# Patient Record
Sex: Female | Born: 1970 | Hispanic: Yes | Marital: Married | Smoking: Never smoker
Health system: Southern US, Community
[De-identification: ages and names within clinical notes are randomized; demographics above are authoritative.]

## PROBLEM LIST (undated history)

## (undated) ENCOUNTER — Emergency Department (HOSPITAL_COMMUNITY): Disposition: A | Discharge status: Home / Self Care | Payer: No Typology Code available for payment source

## (undated) DIAGNOSIS — K8020 Calculus of gallbladder without cholecystitis without obstruction: Secondary | ICD-10-CM

---

## 1998-07-09 ENCOUNTER — Emergency Department (HOSPITAL_COMMUNITY): Admission: EM | Admit: 1998-07-09 | Discharge: 1998-07-09 | Payer: Self-pay | Admitting: Emergency Medicine

## 1999-02-15 ENCOUNTER — Other Ambulatory Visit: Admission: RE | Admit: 1999-02-15 | Discharge: 1999-02-15 | Payer: Self-pay | Admitting: Obstetrics and Gynecology

## 1999-03-20 ENCOUNTER — Encounter: Payer: Self-pay | Admitting: Obstetrics and Gynecology

## 1999-03-20 ENCOUNTER — Ambulatory Visit (HOSPITAL_COMMUNITY): Admission: RE | Admit: 1999-03-20 | Discharge: 1999-03-20 | Payer: Self-pay | Admitting: Obstetrics & Gynecology

## 1999-04-16 ENCOUNTER — Inpatient Hospital Stay (HOSPITAL_COMMUNITY): Admission: AD | Admit: 1999-04-16 | Discharge: 1999-04-16 | Payer: Self-pay | Admitting: Obstetrics & Gynecology

## 1999-08-02 ENCOUNTER — Inpatient Hospital Stay (HOSPITAL_COMMUNITY): Admission: AD | Admit: 1999-08-02 | Discharge: 1999-08-05 | Payer: Self-pay | Admitting: Obstetrics & Gynecology

## 2004-04-14 ENCOUNTER — Emergency Department (HOSPITAL_COMMUNITY): Admission: EM | Admit: 2004-04-14 | Discharge: 2004-04-14 | Payer: Self-pay | Admitting: Emergency Medicine

## 2012-05-14 ENCOUNTER — Ambulatory Visit: Payer: Self-pay | Admitting: Family Medicine

## 2012-05-14 VITALS — BP 118/78 | HR 71 | Temp 98.0°F | Resp 16 | Ht 61.5 in | Wt 152.0 lb

## 2012-05-14 DIAGNOSIS — Z124 Encounter for screening for malignant neoplasm of cervix: Secondary | ICD-10-CM

## 2012-05-14 DIAGNOSIS — Z131 Encounter for screening for diabetes mellitus: Secondary | ICD-10-CM

## 2012-05-14 LAB — POCT GLYCOSYLATED HEMOGLOBIN (HGB A1C): Hemoglobin A1C: 5.1

## 2012-05-14 NOTE — Progress Notes (Signed)
[facility] [address] [place] [address] [phone]  Date:  05/14/2012   Name:  Renee [name]   DOB:  07-Jun-1971   MRN:  [id number]  PCP:  No primary provider on file.    Chief Complaint: Gynecologic Exam   History of Present Illness:  Renee [name] is a 41 y.o. very pleasant female patient who presents with the following:  She would like a pap smear today.  She had an abnormal pap about 5 years ago, but no treatment was necessary.  She has had 2 normal paps since then.   She is otherwise doing well today.   Her mother, father and brother have diabetes.  She does not as far as she knows- she would like to be screened today.   She has 3 children- 56, 58 and 13 years old.  Married, only SA with her husband.  LMP 04/19/12  There is no problem list on file for this patient.   No past medical history on file.  No past surgical history on file.  History  Substance Use Topics  . Smoking status: Never Smoker   . Smokeless tobacco: Not on file  . Alcohol Use: Not on file    No family history on file.  No Known Allergies  Medication list has been reviewed and updated.  No current outpatient prescriptions on file prior to visit.    Review of Systems:  As per HPI- otherwise negative.   Physical Examination: Filed Vitals:   05/14/12 0904  BP: 118/78  Pulse: 71  Temp: 98 F (36.7 C)  Resp: 16   Filed Vitals:   05/14/12 0904  Height: 5' 1.5" (1.562 m)  Weight: 152 lb (68.947 kg)   Body mass index is 28.26 kg/(m^2). Ideal Body Weight: Weight in (lb) to have BMI = 25: 134.2   GEN: WDWN, NAD, Non-toxic, A & O x 3 HEENT: Atraumatic, Normocephalic. Neck supple. No masses, No LAD. Ears and Nose: No external deformity. CV: RRR, No M/G/R. No JVD. No thrill. No extra heart sounds. PULM: CTA B, no wheezes, crackles, rhonchi. No retractions. No resp. distress. No accessory muscle use. ABD: S, NT, ND, +BS. No rebound. No HSM. EXTR: No  c/c/e NEURO Normal gait.  PSYCH: Normally interactive. Conversant. Not depressed or anxious appearing.  Calm demeanor.  GU: normal exam of external genitals, vagina and cervix.  No CMT, no adnexal tenderness or masses  Results for orders placed in visit on 05/14/12  POCT GLYCOSYLATED HEMOGLOBIN (HGB A1C)      Component Value Range   Hemoglobin A1C 5.1     Assessment and Plan: 1. Screening for cervical cancer  Pap IG w/ reflex to HPV when ASC-U  2. Screening for diabetes mellitus (DM)  POCT glycosylated hemoglobin (Hb A1C)   No sign of DM.  Await her pap result and will mail her a copy  Delmont Prosch, MD

## 2012-05-17 LAB — PAP IG W/ RFLX HPV ASCU

## 2012-05-18 ENCOUNTER — Encounter: Payer: Self-pay | Admitting: Family Medicine

## 2013-02-09 ENCOUNTER — Ambulatory Visit (INDEPENDENT_AMBULATORY_CARE_PROVIDER_SITE_OTHER): Payer: 59 | Admitting: Physician Assistant

## 2013-02-09 ENCOUNTER — Other Ambulatory Visit: Payer: Self-pay | Admitting: Physician Assistant

## 2013-02-09 VITALS — BP 118/86 | HR 88 | Temp 98.3°F | Resp 16 | Ht 61.8 in | Wt 153.4 lb

## 2013-02-09 DIAGNOSIS — R1013 Epigastric pain: Secondary | ICD-10-CM

## 2013-02-09 DIAGNOSIS — R748 Abnormal levels of other serum enzymes: Secondary | ICD-10-CM

## 2013-02-09 LAB — POCT CBC
Granulocyte percent: 62.5 %G (ref 37–80)
HCT, POC: 44.8 % (ref 37.7–47.9)
Hemoglobin: 14.1 g/dL (ref 12.2–16.2)
Lymph, poc: 1.8 (ref 0.6–3.4)
MCH, POC: 29.1 pg (ref 27–31.2)
MCHC: 31.5 g/dL — AB (ref 31.8–35.4)
MCV: 92.6 fL (ref 80–97)
MID (cbc): 0.4 (ref 0–0.9)
MPV: 8.4 fL (ref 0–99.8)
POC Granulocyte: 3.6 (ref 2–6.9)
POC LYMPH PERCENT: 30.2 %L (ref 10–50)
POC MID %: 7.3 %M (ref 0–12)
Platelet Count, POC: 321 10*3/uL (ref 142–424)
RBC: 4.84 M/uL (ref 4.04–5.48)
RDW, POC: 13.7 %
WBC: 5.8 10*3/uL (ref 4.6–10.2)

## 2013-02-09 LAB — COMPREHENSIVE METABOLIC PANEL
ALT: 679 U/L — ABNORMAL HIGH (ref 0–35)
AST: 428 U/L — ABNORMAL HIGH (ref 0–37)
Albumin: 4.4 g/dL (ref 3.5–5.2)
Alkaline Phosphatase: 138 U/L — ABNORMAL HIGH (ref 39–117)
BUN: 8 mg/dL (ref 6–23)
CO2: 25 mEq/L (ref 19–32)
Calcium: 9.5 mg/dL (ref 8.4–10.5)
Chloride: 104 mEq/L (ref 96–112)
Creat: 0.67 mg/dL (ref 0.50–1.10)
Glucose, Bld: 130 mg/dL — ABNORMAL HIGH (ref 70–99)
Potassium: 4.5 mEq/L (ref 3.5–5.3)
Sodium: 138 mEq/L (ref 135–145)
Total Bilirubin: 0.5 mg/dL (ref 0.3–1.2)
Total Protein: 7.7 g/dL (ref 6.0–8.3)

## 2013-02-09 MED ORDER — RANITIDINE HCL 300 MG PO TABS: 300.0000 mg | ORAL_TABLET | Freq: Every day | ORAL | Status: DC

## 2013-02-09 NOTE — Progress Notes (Signed)
[address] [place] [address]   Phone [phone]   Subjective:    Patient ID: Renee [name], female    DOB: 1970-11-25, 42 y.o.   MRN: [id number]  HPI  Pt presents to clinic with epigastric abd pain that she had yesterday for several hours.  Today she is fine.  She has had similar pain about 3 times in the past and each time she has been at work.  She lifts some boxes at work yesterday before this happened and then ate breakfast in the cafe - egg cheese potatoes and coffee, none of which are new for her.  The pain was epigastric and radiated to the back.  She got nauseated and vomited once (tasted very acidic).  The pain spontaneously resolved.  She does not feel bad otherwise.  She has heartburn 3-4 times per week but it typically does not feel like this.     Review of Systems  Constitutional: Negative for fever and chills.  Gastrointestinal: Positive for nausea, vomiting and abdominal pain (epigastric). Negative for diarrhea, constipation and blood in stool.  Genitourinary: Negative.        Objective:   Physical Exam  Vitals reviewed. Constitutional: She is oriented to person, place, and time. She appears well-developed and well-nourished.  HENT:  Head: Normocephalic and atraumatic.  Right Ear: External ear normal.  Left Ear: External ear normal.  Eyes: Conjunctivae are normal.  Cardiovascular: Normal rate, regular rhythm and normal heart sounds.   No murmur heard. Pulmonary/Chest: Effort normal and breath sounds normal. She has no wheezes.  Musculoskeletal:       Lumbar back: She exhibits normal range of motion, no tenderness, no bony tenderness and no spasm.  Neurological: She is alert and oriented to person, place, and time.  Skin: Skin is warm and dry.  Psychiatric: She has a normal mood and affect. Her behavior is normal. Judgment and thought content normal.   Results for orders placed in visit on 02/09/13  POCT CBC      Result Value Range   WBC 5.8  4.6 -  10.2 K/uL   Lymph, poc 1.8  0.6 - 3.4   POC LYMPH PERCENT 30.2  10 - 50 %L   MID (cbc) 0.4  0 - 0.9   POC MID % 7.3  0 - 12 %M   POC Granulocyte 3.6  2 - 6.9   Granulocyte percent 62.5  37 - 80 %G   RBC 4.84  4.04 - 5.48 M/uL   Hemoglobin 14.1  12.2 - 16.2 g/dL   HCT, POC 21.3  08.6 - 47.9 %   MCV 92.6  80 - 97 fL   MCH, POC 29.1  27 - 31.2 pg   MCHC 31.5 (*) 31.8 - 35.4 g/dL   RDW, POC 57.8     Platelet Count, POC 321  142 - 424 K/uL   MPV 8.4  0 - 99.8 fL       Assessment & Plan:  Abdominal pain, epigastric- I suspect this to be reflux.  I am going to check LFTs because it is possible that it is gallbladder and we will also wait for the h pylori due to weekly heartburn that is not related to eating.  We will start zantac that she will take for 10 d and then prn heartburn.  Pt will moniotr high greasy, fatty spicy foods as well as caffeine, all of which can make reflux worse. - Plan: POCT CBC, Comprehensive metabolic panel,  ranitidine (ZANTAC) 300 MG tablet, H. pylori antibody, IgG  Benny Lennert PA-C 02/09/2013 9:35 AM

## 2013-02-10 LAB — H. PYLORI ANTIBODY, IGG: H Pylori IgG: <0.4 {ISR}

## 2013-02-10 NOTE — Addendum Note (Signed)
Addended by: Johnnette Litter on: 02/10/2013 06:42 PM   Modules accepted: Orders

## 2013-02-10 NOTE — Addendum Note (Signed)
Addended by: Morrell Riddle on: 02/10/2013 02:34 PM   Modules accepted: Orders

## 2013-02-11 LAB — HEPATITIS PANEL, ACUTE
HCV Ab: NEGATIVE
Hep A IgM: NEGATIVE
Hep B C IgM: NEGATIVE
Hepatitis B Surface Ag: NEGATIVE

## 2013-02-23 ENCOUNTER — Ambulatory Visit
Admission: RE | Admit: 2013-02-23 | Discharge: 2013-02-23 | Disposition: A | Discharge status: Home / Self Care | Payer: 59 | Source: Ambulatory Visit | Attending: Physician Assistant | Admitting: Physician Assistant

## 2013-02-23 DIAGNOSIS — R1013 Epigastric pain: Secondary | ICD-10-CM

## 2013-02-23 DIAGNOSIS — R748 Abnormal levels of other serum enzymes: Secondary | ICD-10-CM

## 2013-02-24 ENCOUNTER — Other Ambulatory Visit: Payer: Self-pay

## 2013-02-28 ENCOUNTER — Ambulatory Visit (INDEPENDENT_AMBULATORY_CARE_PROVIDER_SITE_OTHER): Payer: 59 | Admitting: Physician Assistant

## 2013-02-28 VITALS — BP 110/78 | HR 60 | Temp 98.3°F | Resp 16 | Ht 61.5 in | Wt 154.8 lb

## 2013-02-28 DIAGNOSIS — K8020 Calculus of gallbladder without cholecystitis without obstruction: Secondary | ICD-10-CM | POA: Insufficient documentation

## 2013-02-28 DIAGNOSIS — K829 Disease of gallbladder, unspecified: Secondary | ICD-10-CM

## 2013-02-28 DIAGNOSIS — R748 Abnormal levels of other serum enzymes: Secondary | ICD-10-CM

## 2013-02-28 NOTE — Progress Notes (Signed)
   [address] [place] [address]   Phone [phone]  Subjective:    Patient ID: Renee [name], female    DOB: 10/05/1970, 42 y.o.   MRN: [id number]  HPI  Pt presents to clinic for discussion about her gallbladder disease.  She has had no problems with the pain since her last OV.  She is not sure that she wants to see a surgeon because she does not want to have surgery.  Review of Systems     Objective:   Physical Exam  Vitals reviewed. Constitutional: She appears well-developed and well-nourished.  HENT:  Head: Normocephalic and atraumatic.  Right Ear: External ear normal.  Left Ear: External ear normal.  Eyes: Conjunctivae are normal.  Cardiovascular: Normal rate, regular rhythm and normal heart sounds.   No murmur heard. Pulmonary/Chest: Effort normal and breath sounds normal. No respiratory distress.  Abdominal: Soft. There is no hepatomegaly. There is no tenderness.       Assessment & Plan:  Gall bladder disease - Plan: Hepatic function panel - d/w pt that she can watch her diet and have low intake of fatty foods and if that controls her pain great - if her pain returns and if does not improve with time and she gets sick then she needs to return to clinic but for now it is ok to watch and wait.  Elevated liver enzymes - Plan: Hepatic function panel - recheck her labs -   Benny Lennert [facility] 02/28/2013 8:46 PM

## 2013-03-01 LAB — HEPATIC FUNCTION PANEL
ALT: 18 U/L (ref 0–35)
Albumin: 4.4 g/dL (ref 3.5–5.2)
Total Protein: 7.3 g/dL (ref 6.0–8.3)

## 2013-08-11 ENCOUNTER — Emergency Department (HOSPITAL_COMMUNITY): Payer: 59

## 2013-08-11 ENCOUNTER — Emergency Department (HOSPITAL_COMMUNITY)
Admission: EM | Admit: 2013-08-11 | Discharge: 2013-08-12 | Disposition: A | Discharge status: Home / Self Care | Payer: 59 | Attending: Emergency Medicine | Admitting: Emergency Medicine

## 2013-08-11 ENCOUNTER — Encounter (HOSPITAL_COMMUNITY): Payer: Self-pay | Admitting: Emergency Medicine

## 2013-08-11 DIAGNOSIS — S0990XA Unspecified injury of head, initial encounter: Secondary | ICD-10-CM | POA: Insufficient documentation

## 2013-08-11 DIAGNOSIS — S298XXA Other specified injuries of thorax, initial encounter: Secondary | ICD-10-CM | POA: Insufficient documentation

## 2013-08-11 DIAGNOSIS — M62838 Other muscle spasm: Secondary | ICD-10-CM | POA: Insufficient documentation

## 2013-08-11 DIAGNOSIS — Y9389 Activity, other specified: Secondary | ICD-10-CM | POA: Insufficient documentation

## 2013-08-11 DIAGNOSIS — Y92410 Unspecified street and highway as the place of occurrence of the external cause: Secondary | ICD-10-CM | POA: Insufficient documentation

## 2013-08-11 MED ORDER — IBUPROFEN 400 MG PO TABS
600.0000 mg | ORAL_TABLET | Freq: Once | ORAL | Status: AC
  Administered 2013-08-11: 600 mg via ORAL
  Filled 2013-08-11 (×2): qty 1

## 2013-08-11 NOTE — ED Notes (Signed)
Patient transported to X-ray 
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
## 2013-08-11 NOTE — ED Notes (Signed)
PA at bedside with student.
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
## 2013-08-12 MED ORDER — CYCLOBENZAPRINE HCL 10 MG PO TABS: 10.0000 mg | ORAL_TABLET | Freq: Two times a day (BID) | ORAL | Status: DC | PRN

## 2013-08-12 NOTE — ED Provider Notes (Signed)
Medical screening examination/treatment/procedure(s) were performed by non-physician practitioner and as supervising physician I was immediately available for consultation/collaboration.  EKG Interpretation   None         Wendi Maya, MD 08/12/13 1044

## 2013-08-19 ENCOUNTER — Ambulatory Visit (INDEPENDENT_AMBULATORY_CARE_PROVIDER_SITE_OTHER): Payer: 59 | Admitting: Emergency Medicine

## 2013-08-19 ENCOUNTER — Ambulatory Visit: Payer: 59

## 2013-08-19 VITALS — BP 118/78 | HR 73 | Temp 98.7°F | Resp 16 | Ht 61.5 in | Wt 149.8 lb

## 2013-08-19 DIAGNOSIS — R0789 Other chest pain: Secondary | ICD-10-CM

## 2013-08-19 DIAGNOSIS — S20219A Contusion of unspecified front wall of thorax, initial encounter: Secondary | ICD-10-CM

## 2013-08-19 MED ORDER — TRAMADOL HCL 50 MG PO TABS: 50.0000 mg | ORAL_TABLET | Freq: Three times a day (TID) | ORAL | Status: DC | PRN
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
## 2013-08-19 NOTE — Patient Instructions (Signed)
Chest conContusin en el trax  (Chest Contusion)  Una contusin en el trax es un hematoma profundo en esa zona. Las contusiones son el resultado de una lesin que causa sangrado debajo de la piel. Puede causar un hematoma en la piel, los msculos o las costillas. La zona de la contusin puede ponerse Bowman, Calvin o Cricket. Las lesiones menores no causan Engineer, mining, Biomedical engineer las ms graves pueden presentar dolor e inflamacin durante un par de semanas. CAUSAS  La causa de la contusin generalmente es un golpe, un traumatismo o una fuerza directa ejercida sobre una zona del cuerpo.  SNTOMAS   Hinchazn y enrojecimiento en la zona lesionada.  Cambios de coloracin de la piel en esa zona.  Sensibilidad y Art therapist.  Dolor. DIAGNSTICO  El diagnstico puede hacerse realizando una historia clnica y un examen fsico. Podra ser necesario tomar una radiografa, tomografa computada (TC) o una resonancia magntica (RMN) para determinar si hubo lesiones asociadas, como por ejemplo huesos rotos (fracturas) o lesiones internas.  TRATAMIENTO  El mejor tratamiento para la contusin en el trax es el reposo, la aplicacin de hielo y compresas fras en la zona de la lesin. Podrn indicarle ejercicios de respiracin profunda para reducir el riesgo de neumona. Para calmar el dolor tambin podrn indicarle medicamentos de venta libre.  INSTRUCCIONES PARA EL CUIDADO EN EL HOGAR   Aplique hielo sobre la zona lesionada.  Ponga el hielo en una bolsa plstica.  Colquese una toalla entre la piel y la bolsa de hielo.  Deje el hielo durante 15 a 20 minutos, 3 a 4 veces por da.  Tome slo medicamentos de venta libre o recetados, segn las indicaciones del mdico. El mdico podr indicarle que evite tomar antiinflamatorios (aspirina, ibuprofeno y naproxeno) durante 48 horas ya que estos medicamentos pueden aumentar los hematomas.  Haga que la zona lesionada repose.  Haga ejercicios de respiracin  profunda segn las indicaciones de su mdico.  Si fuma, abandone el hbito.  No levante objetos ms pesados que 5 libras (2.3 kg.) durante 3 das o ms, si se lo indican. SOLICITE ATENCIN MDICA DE INMEDIATO SI:   El hematoma o la hinchazn aumentan.  Siente dolor que College.  Tiene dificultad para respirar.  Se siente mareado, dbil o se desmaya.  Observa sangre en la orina.  Tose o vomita sangre.  La hinchazn o el dolor no se [employer]. ASEGRESE DE QUE:   Comprende estas instrucciones.  Controlar su enfermedad.  Solicitar ayuda de inmediato si no mejora o si empeora. Document Released: 06/25/2005 Document Revised: 06/09/2012 [facility] Patient Information 2014 [place], [place].

## 2013-10-11 ENCOUNTER — Ambulatory Visit (INDEPENDENT_AMBULATORY_CARE_PROVIDER_SITE_OTHER): Payer: 59 | Admitting: Family Medicine

## 2013-10-11 VITALS — BP 110/70 | HR 62 | Temp 98.3°F | Resp 16 | Ht 61.5 in | Wt 152.0 lb

## 2013-10-11 DIAGNOSIS — Z8742 Personal history of other diseases of the female genital tract: Secondary | ICD-10-CM

## 2013-10-11 NOTE — Progress Notes (Signed)
Subjective:  Patient has a history of having had an abnormal sometime ago. She was concerned thinking that it was never biopsied and she was worried about it. She had a normal Pap smear less than 2 years ago, but wondered if she should have had some kind of a biopsy done. I reviewed the old record in the computer. She has been married to the same man for over 20 years, has no suspicion of infidelity on his part, and she has been faithful to him for all these years.  Objective: Pleasant lady in no major distress no CVA tenderness. Abdomen soft without mass or tenderness. Normal external genitalia. Vaginal mucosa appears unremarkable. There is a lot of mucus which was swabbed out. The cervix appears multiparous. There is a small cyst at about 11:00. No other abnormalities were suspected. Pap was taken using the speculum in brush carefully. Patient tolerated well.  Assessment: History of abnormal Pap  Plan: Pap 2 was done. Explained to her that if it is normal we will repeat only every 3 years. If it is abnormal we will make a referral for her. She was content with that.

## 2013-10-11 NOTE — Patient Instructions (Signed)
If your Pap test is normal I will recommend checking about every 3 years  If the Pap smear is abnormal we will make a referral elsewhere.  I think your risk of cervical cancers is very very low because of your long marital history.

## 2013-10-13 LAB — PAP IG W/ RFLX HPV ASCU

## 2013-10-14 LAB — HUMAN PAPILLOMAVIRUS, HIGH RISK: HPV DNA High Risk: NOT DETECTED

## 2013-11-01 ENCOUNTER — Ambulatory Visit (INDEPENDENT_AMBULATORY_CARE_PROVIDER_SITE_OTHER): Payer: 59 | Admitting: Physician Assistant

## 2013-11-01 VITALS — BP 114/68 | HR 70 | Temp 98.4°F | Resp 16 | Ht 61.5 in | Wt 152.0 lb

## 2013-11-01 DIAGNOSIS — H6120 Impacted cerumen, unspecified ear: Secondary | ICD-10-CM

## 2013-11-01 DIAGNOSIS — H9201 Otalgia, right ear: Secondary | ICD-10-CM

## 2013-11-01 DIAGNOSIS — H9209 Otalgia, unspecified ear: Secondary | ICD-10-CM

## 2013-11-01 NOTE — Progress Notes (Signed)
   Subjective:    Patient ID: Renee [name], female    DOB: January 10, 1971, 43 y.o.   MRN: [id number]  HPI Pt presents to clinic with right ear pain and trouble hearing since yesterday.  2 days ago she started to have some pain in her ear and then yesterday while she was showering she got water in her ear and now she cannot hear well out of that ear.  She is not having any cold symptoms.  She has used no medications.  Review of Systems  Constitutional: Negative for fever and chills.  HENT: Positive for ear pain (right only) and hearing loss (right ear only). Negative for ear discharge.        Objective:   Physical Exam  Vitals reviewed. Constitutional: She appears well-developed and well-nourished.  HENT:  Head: Normocephalic and atraumatic.  Right Ear: Hearing, tympanic membrane and external ear normal.  Left Ear: Hearing, tympanic membrane, external ear and ear canal normal.  Right ear had cerumen impaction that was completely clear with H2O2 lavage.   Pulmonary/Chest: Effort normal.  Skin: Skin is warm and dry.  Psychiatric: She has a normal mood and affect. Her behavior is normal. Judgment and thought content normal.      Assessment & Plan:  Ear pain, right  Cerumen impaction  Pt states after the lavage that she feels much better and her hearing has returned.  D/w pt her Pap results and she has ASCUS but with HPV neg so the ACOG guidelines states pap in 3 years with HPV testing. Benny LennertSarah Rayelle Armor PA-C 11/01/2013 7:35 PM

## 2013-11-19 ENCOUNTER — Ambulatory Visit (INDEPENDENT_AMBULATORY_CARE_PROVIDER_SITE_OTHER): Payer: 59 | Admitting: Internal Medicine

## 2013-11-19 VITALS — BP 108/70 | HR 64 | Temp 97.9°F | Resp 18 | Ht 62.0 in | Wt 152.0 lb

## 2013-11-19 DIAGNOSIS — M545 Low back pain, unspecified: Secondary | ICD-10-CM

## 2013-11-19 MED ORDER — MELOXICAM 15 MG PO TABS: 15.0000 mg | ORAL_TABLET | Freq: Every day | ORAL | Status: DC

## 2013-11-19 MED ORDER — CYCLOBENZAPRINE HCL 10 MG PO TABS: 10.0000 mg | ORAL_TABLET | Freq: Every day | ORAL | Status: DC

## 2013-11-19 NOTE — Patient Instructions (Signed)
We will need to restart physical therapy Recheck in one month You may continue current restrictions suggested by Dr. Yevette Edwardsumonski

## 2013-11-19 NOTE — Progress Notes (Addendum)
   Subjective:    Patient ID: Renee [name], female    DOB: 1970-12-31, 43 y.o.   MRN: [id number]  HPI Note history and paper chart covering worker's compensation evaluation for injury several months ago with eventual referral to Dr. Johnney Killianumonski--she was discharged with pain at 3/10 with work restrictions and advised to come back to us for medication. Over the past several weeks her pain has escalated and for the past few days has been 6 or 7/10 greatly restricting her activities. She describes her pain as midline without radicular symptoms but with worsening on sitting. She has been able to work because her job is mainly standing and walking  She gets limited relief from her medications She was given home physical therapy but is having a hard time performing some of the exercises at home due to knowledge  And no other current medical problems affecting this   Review of Systems No gastrointestinal symptoms No genitourinary symptoms No fever weight loss or night sweats    Objective:   Physical Exam BP 108/70  Pulse 64  Temp(Src) 97.9 F (36.6 C) (Oral)  Resp 18  Ht 5\' 2"  (1.575 m)  Wt 152 lb (68.947 kg)  BMI 27.79 kg/m2  SpO2 98%  LMP 11/08/2013 No acute distress Mild overweight Neck supple Lumbosacral area tender to palpation along the midline in the lower parts without tenderness in the SI joints Straight leg raise is positive at 90 with pain midline but without radicular symptoms Deep tendon reflexes are 2+ symmetrically And no sensory losses and lower extremities Hip range of motion is good bilaterally without pain No peripheral edema Peripheral pulses       Assessment & Plan:  Lumbar pain  has not responded to orthopedic intervention with physical therapy Has had difficulty carrying out their expectations for home physical therapy and this may be one reason she has persistent pain She was discharged from orthopedics with restrictions on lifting and some restricted  duties so this case is not closed She had an MRI done by orthopedics We do not have any of the results of her orthopedic intervention for now we'll obtain these  Meds ordered this encounter  Medications  . DISCONTD: ibuprofen (ADVIL,MOTRIN) 600 MG tablet    Sig: Take 600 mg by mouth 3 (three) times daily.  Marland Kitchen. DISCONTD: methocarbamol (ROBAXIN) 500 MG tablet    Sig: Take 500 mg by mouth 3 (three) times daily.  . meloxicam (MOBIC) 15 MG tablet    Sig: Take 1 tablet (15 mg total) by mouth daily.    Dispense:  30 tablet    Refill:  0  . cyclobenzaprine (FLEXERIL) 10 MG tablet    Sig: Take 1 tablet (10 mg total) by mouth at bedtime.    Dispense:  30 tablet    Refill:  0   She will need referral back to physical therapy to decide on a home program that she can actually manage, can understand, can have the right equipment etc. Specifically needs more core strengthening  Addend 11/23/13 Notes [facility]onski [facility] 9/14 wnl x minimal bulge L5-S1 PT worked so d/c with permanent disability 3% and permanent restr no lift 20lbs, no ladders To us for f/u care beyond [facility]

## 2013-12-12 ENCOUNTER — Other Ambulatory Visit (INDEPENDENT_AMBULATORY_CARE_PROVIDER_SITE_OTHER): Payer: 59 | Admitting: Family Medicine

## 2013-12-12 ENCOUNTER — Encounter: Payer: Self-pay | Admitting: Family Medicine

## 2013-12-12 DIAGNOSIS — Z20828 Contact with and (suspected) exposure to other viral communicable diseases: Secondary | ICD-10-CM

## 2013-12-12 NOTE — Progress Notes (Signed)
   Subjective:    Patient ID: Renee [name], female    DOB: 12/16/1970, 43 y.o.   MRN: [id number]  HPI Exposed to her daughter to HSV. Patient anxious about possibly contracting HSV from her daughter.   Review of Systems     Objective:   Physical Exam  No acute distress HEENT unremarkable with no icterus      Assessment & Plan:  Herpes exposure - Plan: HSV(herpes simplex vrs) 1+2 ab-IgG  Signed, Elvina SidleKurt Dysen Edmondson, MD

## 2013-12-14 LAB — HSV(HERPES SIMPLEX VRS) I + II AB-IGG
HSV 1 Glycoprotein G Ab, IgG: 10.53 IV — ABNORMAL HIGH
HSV 2 Glycoprotein G Ab, IgG: 14.22 IV — ABNORMAL HIGH

## 2013-12-15 ENCOUNTER — Telehealth: Payer: Self-pay

## 2013-12-17 ENCOUNTER — Ambulatory Visit (INDEPENDENT_AMBULATORY_CARE_PROVIDER_SITE_OTHER): Payer: 59 | Admitting: Emergency Medicine

## 2013-12-17 VITALS — BP 110/78 | HR 77 | Temp 98.4°F | Resp 16 | Ht 61.5 in | Wt 147.0 lb

## 2013-12-17 DIAGNOSIS — S335XXA Sprain of ligaments of lumbar spine, initial encounter: Secondary | ICD-10-CM

## 2013-12-17 MED ORDER — MELOXICAM 15 MG PO TABS: 15.0000 mg | ORAL_TABLET | Freq: Every day | ORAL | Status: DC

## 2013-12-17 MED ORDER — CYCLOBENZAPRINE HCL 10 MG PO TABS: 10.0000 mg | ORAL_TABLET | Freq: Every day | ORAL | Status: DC

## 2013-12-17 NOTE — Patient Instructions (Signed)
Ejercicios para la espalda  (Back Exercises)  Estos ejercicios ayudan a tratar y prevenir lesiones en la espalda. El objetivo es aumentar la fuerza de los músculos abdominales y dorsales y la flexibilidad de la espalda. Debe comenzar con estos ejercicios cuando ya no tenga dolor. Los ejercicios para la espalda incluyen:  · Inclinación de la pelvis - Recuéstese sobre la espalda con las rodillas flexionadas. Incline la pelvis hasta que la parte inferior de la espalda se apoye en el piso. Mantenga esta posición durante 5 a 10 segundos y repita entre 5 y 10 veces.  · Rodilla al pecho  Empuje primero una rodilla contra el pecho y mantenga durante 20 a 30 segundos; repita con la otra rodilla y luego con ambas a la vez. Esto puede realizarlo con la otra pierna extendida o flexionada, del modo en que se sienta más cómodo.  · Abdominales o despegar el cóccix del suelo empleando la musculatura abdominal  Flexione las rodillas 90 grados. Comience inclinando la pelvis y realice un ejercicio abdominal lento y parcial, elevando el tronco sólo entre 30 y 45 grados del suelo. Emplee al menos entre 2 y 3 segundos para cada abdominal. No realice los abdominales con las rodillas extendidas. Si le resulta difícil realizar abdominales parciales, simplemente haga lo que se explicó anteriormente, pero sólo contraiga los músculos abdominales y manténgalos tal como se le ha indicado.  · Inclinación de la cadera - Recuéstese sobre la espalda con las rodillas flexionadas a 90 grados. Empújese con los pies y los hombros mientras eleva la cadera un par de centímetros del suelo, mantenga durante 10 segundos y repita entre 5 y 10 veces.  · Arcos dorsales  Acuéstese sobre el estómago e impulse el tronco hacia atrás sobre los codos flexionados. Presione lentamente con las manos, formando un arco con la zona inferior de la espalda. Repita entre 3 y 5 veces. Al realizar las repeticiones, luego de un tiempo disminuirán la rigidez y las  molestias.  · Elevación de los hombros  Acuéstese hacia abajo con los brazos a los lados del cuerpo. Presione las caderas y el torso contra el suelo mientras eleva lentamente la cabeza y los hombros del suelo.  No exagere con los ejercicios, especialmente en el comienzo. Los ejercicios pueden causar alguna molestia leve en la espalda durante algunos minutos; sin embargo, si el dolor es muy intenso, o dura más de 15 minutos, no siga con la actividad física hasta que consulte al profesional que lo asiste. Los problemas en la espalda mejoran de manera lenta con esta terapia.   Consulte al profesional para que lo ayude a planificar un programa de ejercicios adecuado para su espalda.  Document Released: 09/15/2005 Document Revised: 12/08/2011  ExitCare® Patient Information ©2014 [employer].

## 2013-12-17 NOTE — Progress Notes (Signed)
[facility] [address], [place] [address] [phone]  Date:  12/17/2013   Name:  Renee [name] [name]   DOB:  1971-03-21   MRN:  [id number]  PCP:  No PCP Per Patient    Chief Complaint: Back Pain   History of Present Illness:  Renee [name] Goley is [name] 43 y.o. very pleasant female patient who presents with the following:  History of [name] back injury last summer.  Says she lifted [name] box that was "too heavy" and had back pain.  Does [name] different job now with no lifting.  She has come back in requesting more of the "same" medicine she was on for her back as she has an increase in the pain she had.  Non radiating. No neuro symptoms.  Had plain films and MRI.  Denies current injury or overuse.  Attributes her pain to stopping her medication 3 weeks ago.  Seems to have poor understanding of her problem and wants to attribute everything to her remote back injury.  No improvement with over the counter medications or other home remedies. Denies other complaint or health concern today.   Patient Active Problem List   Diagnosis Date Noted  . Gall bladder stones 02/28/2013    History reviewed. No pertinent past medical history.  History reviewed. No pertinent past surgical history.  History  Substance Use Topics  . Smoking status: Never Smoker   . Smokeless tobacco: Not on file  . Alcohol Use: No    Family History  Problem Relation Age of Onset  . Diabetes Mother   . Diabetes Father   . Diabetes Brother     No Known Allergies  Medication list has been reviewed and updated.  Current Outpatient Prescriptions on File Prior to Visit  Medication Sig Dispense Refill  . cyclobenzaprine (FLEXERIL) 10 MG tablet Take 1 tablet (10 mg total) by mouth at bedtime.  30 tablet  0  . meloxicam (MOBIC) 15 MG tablet Take 1 tablet (15 mg total) by mouth daily.  30 tablet  0   No current facility-administered medications on file prior to visit.    Review of Systems:  As per HPI,  otherwise negative.    Physical Examination: Filed Vitals:   12/17/13 0809  BP: 110/78  Pulse: 77  Temp: 98.4 F (36.9 C)  Resp: 16   Filed Vitals:   12/17/13 0809  Height: 5' 1.5" (1.562 m)  Weight: 147 lb (66.679 kg)   Body mass index is 27.33 kg/(m^2). Ideal Body Weight: Weight in (lb) to have BMI = 25: 134.2   GEN: WDWN, NAD, Non-toxic, Alert & Oriented x 3 HEENT: Atraumatic, Normocephalic.  Ears and Nose: No external deformity. EXTR: No clubbing/cyanosis/edema NEURO: Normal gait.  PSYCH: Normally interactive. Conversant. Not depressed or anxious appearing.  Calm demeanor.  Back:  Normal motor tender in lumbar paravertebral muscles.  Worse on left.  Assessment and Plan: Lumbar strain Renew meds Local heat Refused PT  Signed,  Phillips OdorJeffery Jazsmin Couse, MD

## 2014-01-09 NOTE — Telephone Encounter (Signed)
No msg °

## 2014-02-04 ENCOUNTER — Ambulatory Visit (INDEPENDENT_AMBULATORY_CARE_PROVIDER_SITE_OTHER): Payer: 59 | Admitting: Internal Medicine

## 2014-02-04 VITALS — BP 114/68 | HR 68 | Temp 98.0°F | Resp 18 | Ht 60.05 in | Wt 144.0 lb

## 2014-02-04 DIAGNOSIS — G8929 Other chronic pain: Secondary | ICD-10-CM

## 2014-02-04 DIAGNOSIS — M549 Dorsalgia, unspecified: Secondary | ICD-10-CM

## 2014-02-04 DIAGNOSIS — S39012A Strain of muscle, fascia and tendon of lower back, initial encounter: Secondary | ICD-10-CM

## 2014-02-04 DIAGNOSIS — Z79899 Other long term (current) drug therapy: Secondary | ICD-10-CM

## 2014-02-04 DIAGNOSIS — X503XXA Overexertion from repetitive movements, initial encounter: Secondary | ICD-10-CM

## 2014-02-04 DIAGNOSIS — S335XXA Sprain of ligaments of lumbar spine, initial encounter: Secondary | ICD-10-CM

## 2014-02-04 MED ORDER — CYCLOBENZAPRINE HCL 10 MG PO TABS: 10.0000 mg | ORAL_TABLET | Freq: Every day | ORAL | Status: DC

## 2014-02-04 MED ORDER — MELOXICAM 15 MG PO TABS: 15.0000 mg | ORAL_TABLET | Freq: Every day | ORAL | Status: DC

## 2014-02-04 NOTE — Progress Notes (Signed)
   Subjective:    Patient ID: Renee [name], female    DOB: [birth year], 43 y.o.   MRN: [id number]  HPI 43 year old female complains of back pain. Has had an accident at work lifting a box a hurt her back June 2014. She was seen by another physician 3 months ago and was prescribed mobic she would like a refill on this medicine.  Pain worse on right, no rad, weakness, numbness, incontinence.  Review of Systems     Objective:   Physical Exam        Assessment & Plan:  Chronic LBP/consider disc

## 2014-02-04 NOTE — Patient Instructions (Signed)
Herniated Disk The bones of your spinal column (vertebrae) protect your spinal cord and nerves that go into your arms and legs. The vertebrae are separated by disks that cushion the spinal column and put space between your vertebrae. This allows movement between the vertebrae, which allows you to bend, rotate, and move your body from side to side. Sometimes, the disks move out of place (herniate) or break open (rupture) from injury or strain. The most common area for a disk herniation is in the lower back (lumbar area). Sometimes herniation occurs in the neck (cervical) disks.  CAUSES  As we grow older, the strong, fibrous cords that connect the vertebrae and support and surround the disks (ligaments) start to weaken. A strain on the back may cause a break in the disk ligaments. RISK FACTORS Herniated disks occur most often in men who are aged 18 years to 35 years, usually after strenuous activity. Other risk factors include conditions present at birth (congenital) that affect the size of the lumbar spinal canal. Additionally, a narrowing of the areas where the nerves exit the spinal canal can occur as you age. SYMPTOMS  Symptoms of a herniated disk vary. You may have weakness in certain muscles. This weakness can include difficulty lifting your leg or arm, difficulty standing on your toes on one side, or difficulty squeezing tightly with one of your hands. You may have numbness. You may feel a mild tingling, dull ache, or a burning or pulsating pain. In some cases, the pain is severe enough that you are unable to move. The pain most often occurs on one side of the body. The pain often starts slowly. It may get worse:  After you sit or stand.  At night.  When you sneeze, cough, or laugh.  When you bend backwards or walk more than a few yards. The pain, numbness, or weakness will often go away or improve a lot over a period of weeks to months. Herniated lumbar disk Symptoms of a herniated lumbar  disk may include sharp pain in one part of your leg, hip, or buttocks and numbness in other parts. You also may feel pain or numbness on the back of your calf or the top or sole of your foot. The same leg also may feel weak. Herniated cervical disk Symptoms of a herniated cervical disk may include pain when you move your neck, deep pain near or over your shoulder blade, or pain that moves to your upper arm, forearm, or fingers. DIAGNOSIS  To diagnose a herniated disk, your caregiver will perform a physical exam. Your caregiver also may perform diagnostic tests to see your disk or to test the reaction of your muscles and the function of your nerves. During the physical exam, your caregiver may ask you to:  Sit, stand, and walk. While you walk, your caregiver may ask you to try walking on your toes and then your heels.  Bend forward, backward, and sideways.  Raise your shoulders, elbow, wrist, and fingers and check your strength during these tasks. Your caregiver will check for:  Numbness or loss of feeling.  Muscle reflexes, which may be slower or missing.  Muscle strength, which may be weaker.  Posture or the way your spine curves. Diagnostic tests that may be done include:  A spinal X-ray exam to rule out other causes of back pain.  Magnetic resonance imaging (MRI) or computed tomography (CT) scan, which will show if the herniated disk is pressing on your spinal canal.  Electromyography.   This is sometimes used to identify the specific area of nerve involvement. TREATMENT  Initial treatment for a herniated disk is a short period of rest with medicines for pain. Pain medicines can include nonsteroidal anti-inflammatory medicines (NSAIDs), muscle relaxants for back spasms, and (rarely) narcotic pain medicine for severe pain that does not respond to NSAID use. Bed rest is often limited to 1 or 2 days at the most because prolonged rest can delay recovery. When the herniation involves the  lower back, sitting should be avoided as much as possible because sitting increases pressure on the ruptured disk. Sometimes a soft neck collar will be prescribed for a few days to weeks to help support your neck in the case of a cervical herniation. Physical therapy is often prescribed for patients with disk disease. Physical therapists will teach you how to properly lift, dress, walk, and perform other activities. They will work on strengthening the muscles that help support your spine. In some cases, physical therapy alone is not enough to treat a herniated disk. Steroid injections along the involved nerve root may be needed to help control pain. The steroid is injected in the area of the herniated disk and helps by reducing swelling around the disk. Sometimes surgery is the best option to treat a herniated disk.  SEEK IMMEDIATE MEDICAL CARE IF:   You have numbness, tingling, weakness, or problems with the use of your arms or legs.  You have severe headaches that are not relieved with the use of medicines.  You notice a change in your bowel or bladder control.  You have increasing pain in any areas of your body.  You experience shortness of breath, dizziness, or fainting. MAKE SURE YOU:   Understand these instructions.  Will watch your condition.  Will get help right away if you are not doing well or get worse. Document Released: 09/12/2000 Document Revised: 12/08/2011 Document Reviewed: 04/18/2011 ExitCare Patient Information 2014 [employer].  

## 2014-03-15 ENCOUNTER — Ambulatory Visit (INDEPENDENT_AMBULATORY_CARE_PROVIDER_SITE_OTHER): Payer: 59

## 2014-03-15 ENCOUNTER — Ambulatory Visit (INDEPENDENT_AMBULATORY_CARE_PROVIDER_SITE_OTHER): Payer: 59 | Admitting: Family Medicine

## 2014-03-15 VITALS — BP 110/70 | HR 67 | Temp 98.5°F | Resp 16 | Ht 61.0 in | Wt 142.0 lb

## 2014-03-15 DIAGNOSIS — Z8719 Personal history of other diseases of the digestive system: Secondary | ICD-10-CM

## 2014-03-15 DIAGNOSIS — R1011 Right upper quadrant pain: Secondary | ICD-10-CM

## 2014-03-15 DIAGNOSIS — R1013 Epigastric pain: Secondary | ICD-10-CM

## 2014-03-15 LAB — POCT CBC
Granulocyte percent: 62.1 %G (ref 37–80)
HCT, POC: 37.1 % — AB (ref 37.7–47.9)
Hemoglobin: 11.8 g/dL — AB (ref 12.2–16.2)
Lymph, poc: 2.8 (ref 0.6–3.4)
MCH, POC: 27.9 pg (ref 27–31.2)
MCHC: 31.8 g/dL (ref 31.8–35.4)
MCV: 87.6 fL (ref 80–97)
MID (cbc): 0.5 (ref 0–0.9)
MPV: 8 fL (ref 0–99.8)
POC Granulocyte: 5.5 (ref 2–6.9)
POC LYMPH PERCENT: 31.8 %L (ref 10–50)
POC MID %: 6.1 %M (ref 0–12)
Platelet Count, POC: 321 10*3/uL (ref 142–424)
RBC: 4.23 M/uL (ref 4.04–5.48)
RDW, POC: 15.2 %
WBC: 8.9 10*3/uL (ref 4.6–10.2)

## 2014-03-15 LAB — POCT UA - MICROSCOPIC ONLY
Bacteria, U Microscopic: NEGATIVE
Casts, Ur, LPF, POC: NEGATIVE
Crystals, Ur, HPF, POC: NEGATIVE
Mucus, UA: NEGATIVE
WBC, Ur, HPF, POC: NEGATIVE
Yeast, UA: NEGATIVE

## 2014-03-15 LAB — POCT URINALYSIS DIPSTICK
Bilirubin, UA: NEGATIVE
Glucose, UA: NEGATIVE
Ketones, UA: NEGATIVE
Leukocytes, UA: NEGATIVE
Nitrite, UA: NEGATIVE
Protein, UA: NEGATIVE
Spec Grav, UA: 1.01
Urobilinogen, UA: 0.2
pH, UA: 6

## 2014-03-15 LAB — COMPREHENSIVE METABOLIC PANEL WITH GFR
ALT: 49 U/L — ABNORMAL HIGH (ref 0–35)
Alkaline Phosphatase: 91 U/L (ref 39–117)
Potassium: 4 meq/L (ref 3.5–5.3)
Sodium: 138 meq/L (ref 135–145)
Total Protein: 7.3 g/dL (ref 6.0–8.3)

## 2014-03-15 LAB — COMPREHENSIVE METABOLIC PANEL
AST: 38 U/L — ABNORMAL HIGH (ref 0–37)
Albumin: 4.2 g/dL (ref 3.5–5.2)
BUN: 15 mg/dL (ref 6–23)
CO2: 25 mEq/L (ref 19–32)
Calcium: 9.3 mg/dL (ref 8.4–10.5)
Chloride: 103 mEq/L (ref 96–112)
Creat: 0.69 mg/dL (ref 0.50–1.10)
Glucose, Bld: 89 mg/dL (ref 70–99)
Total Bilirubin: 0.3 mg/dL (ref 0.2–1.2)

## 2014-03-15 NOTE — Progress Notes (Signed)
Chief Complaint:  Chief Complaint  Patient presents with  . Abdominal Pain    since yesterday, has problems with gallbladder hx of gallstones     HPI: Renee [name] is a 43 y.o. female who is here for  Epigastric pain that radiates to the right upper quadrant and to the 7/10 pain She has been having a few times 2 months ago and then one month ago and then another 1 yesterday  + nausea, no vomiting. She tries not to eat. The episode almost made her passed out 4 weeks ago.  She has tried to herbal supplements to see if she is able to pass stones but not sure No problems with constipation, able to pass gas. No diarrhea, fevers chills,  Blood in urien or stool or h.o renal stones.  Currnetly on menses  US from 2014 shows:  IMPRESSION:  Cholelithiasis with wall thickening. Absent sonographic Murphy's  sign. Findings can be seen with chronic cholecystitis.    History reviewed. No pertinent past medical history. History reviewed. No pertinent past surgical history. History   Social History  . Marital Status: Married    Spouse Name: N/A    Number of Children: N/A  . Years of Education: N/A   Social History Main Topics  . Smoking status: Never Smoker   . Smokeless tobacco: None  . Alcohol Use: No  . Drug Use: No  . Sexual Activity: Yes   Other Topics Concern  . None   Social History Narrative  . None   Family History  Problem Relation Age of Onset  . Diabetes Mother   . Diabetes Father   . Diabetes Brother    No Known Allergies Prior to Admission medications   Medication Sig Start Date End Date Taking? Authorizing Provider  cyclobenzaprine (FLEXERIL) 10 MG tablet Take 1 tablet (10 mg total) by mouth at bedtime. 02/04/14   Jonita Albeehris W Guest, MD  meloxicam (MOBIC) 15 MG tablet Take 1 tablet (15 mg total) by mouth daily. 02/04/14   Jonita Albeehris W Guest, MD     ROS: The patient denies fevers, chills, night sweats, unintentional weight loss, chest pain, palpitations,  wheezing, dyspnea on exertion, nausea, vomiting, abdominal pain, dysuria, hematuria, melena, numbness, weakness, or tingling.   All other systems have been reviewed and were otherwise negative with the exception of those mentioned in the HPI and as above.    PHYSICAL EXAM: Filed Vitals:   03/15/14 1612  BP: 110/70  Pulse: 67  Temp: 98.5 F (36.9 C)  Resp: 16   Filed Vitals:   03/15/14 1612  Height: 5\' 1"  (1.549 m)  Weight: 142 lb (64.411 kg)   Body mass index is 26.84 kg/(m^2).  General: Alert, no acute distress HEENT:  Normocephalic, atraumatic, oropharynx patent. EOMI, PERRLA Cardiovascular:  Regular rate and rhythm, no rubs murmurs or gallops.  No Carotid bruits, radial pulse intact. No pedal edema.  Respiratory: Clear to auscultation bilaterally.  No wheezes, rales, or rhonchi.  No cyanosis, no use of accessory musculature GI: No organomegaly, abdomen is soft and minimally midepi-tenderness, positive bowel sounds.  No masses. Skin: No rashes. Neurologic: Facial musculature symmetric. Psychiatric: Patient is appropriate throughout our interaction. Lymphatic: No cervical lymphadenopathy Musculoskeletal: Gait intact.   LABS: Results for orders placed in visit on 03/15/14  POCT CBC      Result Value Ref Range   WBC 8.9  4.6 - 10.2 K/uL   Lymph, poc 2.8  0.6 - 3.4  POC LYMPH PERCENT 31.8  10 - 50 %L   MID (cbc) 0.5  0 - 0.9   POC MID % 6.1  0 - 12 %M   POC Granulocyte 5.5  2 - 6.9   Granulocyte percent 62.1  37 - 80 %G   RBC 4.23  4.04 - 5.48 M/uL   Hemoglobin 11.8 (*) 12.2 - 16.2 g/dL   HCT, POC 96.037.1 (*) 45.437.7 - 47.9 %   MCV 87.6  80 - 97 fL   MCH, POC 27.9  27 - 31.2 pg   MCHC 31.8  31.8 - 35.4 g/dL   RDW, POC 09.815.2     Platelet Count, POC 321  142 - 424 K/uL   MPV 8.0  0 - 99.8 fL  POCT UA - MICROSCOPIC ONLY      Result Value Ref Range   WBC, Ur, HPF, POC neg     RBC, urine, microscopic 0-2     Bacteria, U Microscopic neg     Mucus, UA neg     Epithelial  cells, urine per micros 0-1     Crystals, Ur, HPF, POC neg     Casts, Ur, LPF, POC neg     Yeast, UA neg    POCT URINALYSIS DIPSTICK      Result Value Ref Range   Color, UA yellow     Clarity, UA clear     Glucose, UA neg     Bilirubin, UA neg     Ketones, UA neg     Spec Grav, UA 1.010     Blood, UA large     pH, UA 6.0     Protein, UA neg     Urobilinogen, UA 0.2     Nitrite, UA neg     Leukocytes, UA Negative       EKG/XRAY:   Primary [facility]. No acute cardiopulm issues Abdominal normal   ASSESSMENT/PLAN: Encounter Diagnoses  Name Primary?  . Abdominal pain, right upper quadrant Yes  . Abdominal pain, epigastric   . History of cholelithiasis    Will refer to [facility] surgery for history of gall stones Currently on menses Labs pending F/u prn  Gross sideeffects, risk and benefits, and alternatives of medications d/w patient. Patient is aware that all medications have potential sideeffects and we are unable to predict every sideeffect or drug-drug interaction that may occur.  [place] CapriLE, THAO PHUONG, DO 03/15/2014 5:34 PM

## 2014-03-16 LAB — HELICOBACTER PYLORI  ANTIBODY, IGM: Helicobacter pylori, IgM: 4 U/mL (ref <9.0)

## 2014-03-22 ENCOUNTER — Encounter: Payer: Self-pay | Admitting: Family Medicine

## 2014-03-29 ENCOUNTER — Ambulatory Visit (INDEPENDENT_AMBULATORY_CARE_PROVIDER_SITE_OTHER): Payer: 59 | Admitting: Family Medicine

## 2014-03-29 VITALS — BP 106/60 | HR 60 | Temp 98.2°F | Resp 16 | Ht 61.0 in | Wt 144.2 lb

## 2014-03-29 DIAGNOSIS — Z79899 Other long term (current) drug therapy: Secondary | ICD-10-CM

## 2014-03-29 DIAGNOSIS — Z5189 Encounter for other specified aftercare: Secondary | ICD-10-CM

## 2014-03-29 DIAGNOSIS — S336XXA Sprain of sacroiliac joint, initial encounter: Secondary | ICD-10-CM

## 2014-03-29 DIAGNOSIS — M549 Dorsalgia, unspecified: Secondary | ICD-10-CM

## 2014-03-29 DIAGNOSIS — S39012D Strain of muscle, fascia and tendon of lower back, subsequent encounter: Secondary | ICD-10-CM

## 2014-03-29 DIAGNOSIS — F4321 Adjustment disorder with depressed mood: Secondary | ICD-10-CM

## 2014-03-29 DIAGNOSIS — G8929 Other chronic pain: Secondary | ICD-10-CM

## 2014-03-29 DIAGNOSIS — M545 Low back pain, unspecified: Secondary | ICD-10-CM

## 2014-03-29 MED ORDER — MELOXICAM 15 MG PO TABS: 15.0000 mg | ORAL_TABLET | Freq: Every day | ORAL | Status: DC | PRN

## 2014-03-29 MED ORDER — CYCLOBENZAPRINE HCL 10 MG PO TABS: 10.0000 mg | ORAL_TABLET | Freq: Every evening | ORAL | Status: DC | PRN

## 2014-03-29 NOTE — Patient Instructions (Signed)
I recommend that you contact your Human Resources Dept to ask about their EAP Biomedical engineer(Employee Assistance Program) - almost every company has one of these where they connect you with a counselor or therapist for several free sessions - these are always 100% confidential and they in NO WAY work for your employer nor can this be used against you in any way. In my experience, they often use some of the groups below which are the same ones that I think are best in town.  If you feel more comfortable, many groups have someone who is bilingual as well.  If you are interested in starting medication for depression, please come back to clinic to see me (or another doctor of your choice - this is something that we can help with but can take several weeks to get started and you would likely stay on for at least 6 mos.)  I would recommend massage-type physical therapy for the large low back muscle spasms you have - [facility] would likely work with your insurance to cover this - let me know if you would like referral.  [facility] CiscoBarbara Fousek [address], [place], [place] [address]  [phone]  [facility]  Address: [address], [place], [place] [address]  [phone]  [facility] Address: [address], [place], [place] [address]  [phone]  [facility] [address] [place], [place] Phone: [phone]  Berniece AndreasJulie Whitt with [facility].

## 2014-03-29 NOTE — Progress Notes (Signed)
Subjective:    Patient ID: Renee [name], female    DOB: Feb 05, 1971, 43 y.o.   MRN: [id number] This chart was scribed for Norberto SorensonEva Shaw, MD by Nicholos Johnsenise Iheanachor, Medical Scribe. The patient was seen in room 2. This patient's care was started at 6:55 PM.  Chief Complaint  Patient presents with  . Back Pain    pt is here for refills of the mobic and flexeril.  pt was told that she had to be seen for refills.  pt c/o having some back pain at times.  the medication does help.  pt will also need a note today.      Back Pain   HPI Comments: Renee [name] is a 43 y.o. female who presents to the [facility] for Mobic and Flexeril medication refill. Injured her back lifting a box at work June 2014. She was seen by another physician 4 months ago and was prescribed mobic and she would like a refill on this medicine. [facility] [place] [facility] [place]. Currently does a job with no lifting. When she stops medication, states pain recurs. Has refused physical therapy several times.   Pain intermittent since injury. Does not take Mobic and Flexeril daily; only when pain present. Both medicines using 1-2 times a week. On restriction at work; lighter lifting. Usually takes Flexeril at night; does not use more than once a day. States the medications are labeled by the days of the week so if she didn't use it on that specific day she would throw it away. Reports this is why she ran out of a 6 month refill in less than 6 months.   Would also like information on depression medication and therapists. States the relationships with her co workers are so negative. Discusses comments co workers were saying about her unprovoked. Reports this is making her upset and feeling low at times. Was not bothered before back injury due to constant business; states now she is doing less work and is listening more to what people are saying it is really bothering her.    Patient  Active Problem List   Diagnosis Date Noted  . Back pain, chronic 02/04/2014  . Gall bladder stones 02/28/2013   No past medical history on file. No past surgical history on file. No Known Allergies Prior to Admission medications   Medication Sig Start Date End Date Taking? Authorizing Provider  cyclobenzaprine (FLEXERIL) 10 MG tablet Take 1 tablet (10 mg total) by mouth at bedtime. 02/04/14  Yes Jonita Albeehris W Guest, MD  meloxicam (MOBIC) 15 MG tablet Take 1 tablet (15 mg total) by mouth daily. 02/04/14  Yes Jonita Albeehris W Guest, MD   History   Social History  . Marital Status: Married    Spouse Name: N/A    Number of Children: N/A  . Years of Education: N/A   Occupational History  . Not on file.   Social History Main Topics  . Smoking status: Never Smoker   . Smokeless tobacco: Not on file  . Alcohol Use: No  . Drug Use: No  . Sexual Activity: Yes   Other Topics Concern  . Not on file   Social History Narrative  . No narrative on file   Review of Systems  Musculoskeletal: Positive for back pain.   Triage vitals: BP 106/60  Pulse 60  Temp(Src) 98.2 F (36.8 C) (Oral)  Resp 16  Ht 5\' 1"  (1.549 m)  Wt  144 lb 3.2 oz (65.409 kg)  BMI 27.26 kg/m2  SpO2 99%  LMP 03/13/2014   Objective:  Physical Exam  Vitals reviewed. Constitutional: She is oriented to person, place, and time. She appears well-developed and well-nourished. No distress.  HENT:  Head: Normocephalic and atraumatic.  Eyes: Conjunctivae and EOM are normal.  Neck: Neck supple. No tracheal deviation present.  Cardiovascular: Normal rate.   Pulmonary/Chest: Effort normal. No respiratory distress.  Musculoskeletal: Normal range of motion.  No point tenderness over lumbar spine or paraspinal muscles. Large low lumbar bilateral paraspinal muscle spasm. Right greater than left.   Neurological: She is alert and oriented to person, place, and time.  Reflex Scores:      Patellar reflexes are 2+ on the right side and 2+ on  the left side. Skin: Skin is warm and dry.  Psychiatric: She has a normal mood and affect. Her behavior is normal.   Assessment & Plan:  Instructed patient to contact her HR department at work regarding complaints she discussed today. Will provide referral and recommendation to several therapists in the area. Advised to call if she decides to try Integrative therapies for massage.   Midline low back pain without sciatica  Repetitive strain injury of lower back, subsequent encounter - Plan: meloxicam (MOBIC) 15 MG tablet, cyclobenzaprine (FLEXERIL) 10 MG tablet  Encounter for medication review - Plan: meloxicam (MOBIC) 15 MG tablet, cyclobenzaprine (FLEXERIL) 10 MG tablet  Back pain, chronic - Plan: meloxicam (MOBIC) 15 MG tablet, cyclobenzaprine (FLEXERIL) 10 MG tablet  Adjustment disorder with depressed mood  Meds ordered this encounter  Medications  . meloxicam (MOBIC) 15 MG tablet    Sig: Take 1 tablet (15 mg total) by mouth daily as needed for pain.    Dispense:  30 tablet    Refill:  5  . cyclobenzaprine (FLEXERIL) 10 MG tablet    Sig: Take 1 tablet (10 mg total) by mouth at bedtime as needed for muscle spasms.    Dispense:  30 tablet    Refill:  5    I personally performed the services described in this documentation, which was scribed in my presence. The recorded information has been reviewed and considered, and addended by me as needed.  Norberto SorensonEva Shaw, MD MPH

## 2014-04-03 ENCOUNTER — Ambulatory Visit (INDEPENDENT_AMBULATORY_CARE_PROVIDER_SITE_OTHER): Payer: 59 | Admitting: General Surgery

## 2014-04-04 ENCOUNTER — Telehealth: Payer: Self-pay | Admitting: *Deleted

## 2014-04-04 NOTE — Telephone Encounter (Signed)
[facility] called to let us know pt no showed for an appt with Dr. Gershon MusselBarley yesterday.  Tried to call pt- lm for rtn call.

## 2014-04-25 DIAGNOSIS — Z0271 Encounter for disability determination: Secondary | ICD-10-CM

## 2014-05-02 ENCOUNTER — Ambulatory Visit (INDEPENDENT_AMBULATORY_CARE_PROVIDER_SITE_OTHER): Payer: 59 | Admitting: Surgery

## 2014-05-23 ENCOUNTER — Encounter (INDEPENDENT_AMBULATORY_CARE_PROVIDER_SITE_OTHER): Payer: Self-pay | Admitting: Surgery

## 2014-05-23 ENCOUNTER — Ambulatory Visit (INDEPENDENT_AMBULATORY_CARE_PROVIDER_SITE_OTHER): Payer: 59 | Admitting: Surgery

## 2014-05-23 VITALS — BP 110/60 | HR 72 | Resp 18 | Ht 61.0 in | Wt 143.0 lb

## 2014-05-23 DIAGNOSIS — K8020 Calculus of gallbladder without cholecystitis without obstruction: Secondary | ICD-10-CM

## 2014-05-23 NOTE — Patient Instructions (Signed)
Our schedules will call you back to schedule surgery. 

## 2014-05-23 NOTE — Progress Notes (Signed)
Patient ID: Renee [name], female   DOB: Mar 23, 1971, 43 y.o.   MRN: [id number]  Chief Complaint  Patient presents with  . Other    cholelithiasis    HPI Renee [name] is a 43 y.o. female.   HPI This is a pleasant female referred by Dr. Conley Rolls for evaluation of symptomatic cholelithiasis. She has had known gallstones for over a year. Last year when she had an attack, she even had elevated liver function tests. Her ultrasound at that time showed a thickened gallbladder with stones.  She decided to hold on surgery. Now she is having increasing attacks of epigastric and right upper quadrant abdominal pain. She has some nausea but no vomiting. The pain can be moderate to severe and sharp in nature. Bowel movement are normal. She is otherwise without complaints. History reviewed. No pertinent past medical history.  History reviewed. No pertinent past surgical history.  Family History  Problem Relation Age of Onset  . Diabetes Mother   . Diabetes Father   . Diabetes Brother     Social History History  Substance Use Topics  . Smoking status: Never Smoker   . Smokeless tobacco: Not on file  . Alcohol Use: No    No Known Allergies  Current Outpatient Prescriptions  Medication Sig Dispense Refill  . cyclobenzaprine (FLEXERIL) 10 MG tablet Take 1 tablet (10 mg total) by mouth at bedtime as needed for muscle spasms.  30 tablet  5  . meloxicam (MOBIC) 15 MG tablet Take 1 tablet (15 mg total) by mouth daily as needed for pain.  30 tablet  5   No current facility-administered medications for this visit.    Review of Systems Review of Systems  Constitutional: Negative for fever, chills and unexpected weight change.  HENT: Negative for congestion, hearing loss, sore throat, trouble swallowing and voice change.   Eyes: Negative for visual disturbance.  Respiratory: Negative for cough and wheezing.   Cardiovascular: Negative for chest pain, palpitations and leg swelling.   Gastrointestinal: Positive for nausea and abdominal pain. Negative for vomiting, diarrhea, constipation, blood in stool, abdominal distention and anal bleeding.  Genitourinary: Negative for hematuria, vaginal bleeding and difficulty urinating.  Musculoskeletal: Negative for arthralgias.  Skin: Negative for rash and wound.  Neurological: Negative for seizures, syncope and headaches.  Hematological: Negative for adenopathy. Does not bruise/bleed easily.  Psychiatric/Behavioral: Negative for confusion.    Blood pressure 110/60, pulse 72, resp. rate 18, height  (1.549 m), weight 143 lb (64.864 kg).  Physical Exam Physical Exam  Constitutional: She is oriented to person, place, and time. She appears well-developed and well-nourished. No distress.  HENT:  Head: Normocephalic and atraumatic.  Right Ear: External ear normal.  Left Ear: External ear normal.  Nose: Nose normal.  Mouth/Throat: Oropharynx is clear and moist. No oropharyngeal exudate.  Eyes: Conjunctivae are normal. Pupils are equal, round, and reactive to light. Right eye exhibits no discharge. Left eye exhibits no discharge. No scleral icterus.  Neck: Normal range of motion. Neck supple. No tracheal deviation present.  Cardiovascular: Normal rate, regular rhythm and normal heart sounds.   No murmur heard. Pulmonary/Chest: Effort normal and breath sounds normal. No respiratory distress. She has no wheezes.  Abdominal: Soft. Bowel sounds are normal. There is tenderness. There is no guarding.  Mild tenderness without guarding in the right upper quadrant  Musculoskeletal: Normal range of motion. She exhibits no edema and no tenderness.  Neurological: She is alert and oriented to person, place, and  time.  Skin: Skin is warm and dry. No rash noted. She is not diaphoretic. No erythema.  Psychiatric: Her behavior is normal. Judgment normal.    Data Reviewed I have reviewed her ultrasound showing gallstones and a thickened  gallbladder wall. Liver function tests currently show a normal bilirubin and alkaline phosphatase  Assessment    Symptomatic cholelithiasis     Plan    Laparoscopic cholecystectomy with cholangiogram is recommended. I discussed the procedure in detail.  The patient was given Agricultural engineer.  We discussed the risks and benefits of a laparoscopic cholecystectomy and cholangiogram including, but not limited to bleeding, infection, injury to surrounding structures such as the intestine or liver, bile leak, retained gallstones, need to convert to an open procedure, prolonged diarrhea, blood clots such as  DVT, common bile duct injury, anesthesia risks, and possible need for additional procedures.  The likelihood of improvement in symptoms and return to the patient's normal status is good. We discussed the typical post-operative recovery course.        Carma Dwiggins A 05/23/2014, 11:11 AM

## 2014-06-26 ENCOUNTER — Encounter (HOSPITAL_COMMUNITY): Payer: Self-pay | Admitting: Emergency Medicine

## 2014-06-26 ENCOUNTER — Emergency Department (HOSPITAL_COMMUNITY): Payer: 59

## 2014-06-26 ENCOUNTER — Inpatient Hospital Stay (HOSPITAL_COMMUNITY)
Admission: EM | Admit: 2014-06-26 | Discharge: 2014-06-30 | DRG: 419 | Disposition: A | Discharge status: Home / Self Care | Payer: 59 | Attending: General Surgery | Admitting: General Surgery

## 2014-06-26 DIAGNOSIS — K851 Biliary acute pancreatitis without necrosis or infection: Secondary | ICD-10-CM | POA: Diagnosis present

## 2014-06-26 DIAGNOSIS — K8020 Calculus of gallbladder without cholecystitis without obstruction: Secondary | ICD-10-CM | POA: Diagnosis present

## 2014-06-26 DIAGNOSIS — K859 Acute pancreatitis, unspecified: Secondary | ICD-10-CM | POA: Diagnosis present

## 2014-06-26 DIAGNOSIS — Z833 Family history of diabetes mellitus: Secondary | ICD-10-CM

## 2014-06-26 HISTORY — DX: Calculus of gallbladder without cholecystitis without obstruction: K80.20

## 2014-06-26 LAB — CBC WITH DIFFERENTIAL/PLATELET
BASOS ABS: 0 10*3/uL (ref 0.0–0.1)
Basophils Relative: 0 % (ref 0–1)
EOS PCT: 0 % (ref 0–5)
Eosinophils Absolute: 0 10*3/uL (ref 0.0–0.7)
HCT: 36.2 % (ref 36.0–46.0)
Hemoglobin: 12.2 g/dL (ref 12.0–15.0)
LYMPHS PCT: 6 % — AB (ref 12–46)
Lymphs Abs: 0.8 10*3/uL (ref 0.7–4.0)
MCH: 26.9 pg (ref 26.0–34.0)
MCHC: 33.7 g/dL (ref 30.0–36.0)
MCV: 79.7 fL (ref 78.0–100.0)
Monocytes Absolute: 1.1 10*3/uL — ABNORMAL HIGH (ref 0.1–1.0)
Monocytes Relative: 9 % (ref 3–12)
NEUTROS ABS: 10.8 10*3/uL — AB (ref 1.7–7.7)
NEUTROS PCT: 85 % — AB (ref 43–77)
Platelets: 286 10*3/uL (ref 150–400)
RBC: 4.54 MIL/uL (ref 3.87–5.11)
RDW: 14.2 % (ref 11.5–15.5)
WBC: 12.7 10*3/uL — AB (ref 4.0–10.5)

## 2014-06-26 LAB — COMPREHENSIVE METABOLIC PANEL
ALT: 620 U/L — AB (ref 0–35)
AST: 946 U/L — AB (ref 0–37)
Albumin: 3.6 g/dL (ref 3.5–5.2)
Alkaline Phosphatase: 173 U/L — ABNORMAL HIGH (ref 39–117)
Anion gap: 11 (ref 5–15)
BUN: 10 mg/dL (ref 6–23)
CALCIUM: 8.8 mg/dL (ref 8.4–10.5)
CO2: 24 meq/L (ref 19–32)
Chloride: 103 mEq/L (ref 96–112)
Creatinine, Ser: 0.58 mg/dL (ref 0.50–1.10)
GFR calc Af Amer: >90 mL/min (ref >90)
Glucose, Bld: 123 mg/dL — ABNORMAL HIGH (ref 70–99)
Potassium: 4.4 mEq/L (ref 3.7–5.3)
SODIUM: 138 meq/L (ref 137–147)
Total Bilirubin: 0.8 mg/dL (ref 0.3–1.2)
Total Protein: 7.4 g/dL (ref 6.0–8.3)

## 2014-06-26 LAB — URINE MICROSCOPIC-ADD ON

## 2014-06-26 LAB — URINALYSIS, ROUTINE W REFLEX MICROSCOPIC
GLUCOSE, UA: NEGATIVE mg/dL
KETONES UR: NEGATIVE mg/dL
Nitrite: NEGATIVE
Protein, ur: NEGATIVE mg/dL
Specific Gravity, Urine: 1.021 (ref 1.005–1.030)
Urobilinogen, UA: 1 mg/dL (ref 0.0–1.0)
pH: 5.5 (ref 5.0–8.0)

## 2014-06-26 LAB — LIPASE, BLOOD: Lipase: >3000 U/L — ABNORMAL HIGH (ref 11–59)

## 2014-06-26 LAB — POC URINE PREG, ED: PREG TEST UR: NEGATIVE

## 2014-06-26 MED ORDER — MORPHINE SULFATE 4 MG/ML IJ SOLN
4.0000 mg | Freq: Once | INTRAMUSCULAR | Status: AC
  Administered 2014-06-26: 4 mg via INTRAVENOUS
  Filled 2014-06-26: qty 1

## 2014-06-26 MED ORDER — ONDANSETRON HCL 4 MG/2ML IJ SOLN
4.0000 mg | Freq: Once | INTRAMUSCULAR | Status: AC
  Administered 2014-06-26: 4 mg via INTRAVENOUS
  Filled 2014-06-26: qty 2

## 2014-06-26 MED ORDER — PANTOPRAZOLE SODIUM 40 MG IV SOLR
40.0000 mg | Freq: Every day | INTRAVENOUS | Status: DC
  Administered 2014-06-27 – 2014-06-29 (×4): 40 mg via INTRAVENOUS
  Filled 2014-06-26 (×5): qty 40

## 2014-06-26 MED ORDER — KCL IN DEXTROSE-NACL 20-5-0.9 MEQ/L-%-% IV SOLN
INTRAVENOUS | Status: DC
  Administered 2014-06-27 – 2014-06-28 (×5): via INTRAVENOUS
  Filled 2014-06-26 (×8): qty 1000

## 2014-06-26 MED ORDER — ONDANSETRON HCL 4 MG/2ML IJ SOLN
4.0000 mg | Freq: Four times a day (QID) | INTRAMUSCULAR | Status: DC | PRN
  Administered 2014-06-27: 4 mg via INTRAVENOUS
  Filled 2014-06-26: qty 2

## 2014-06-26 MED ORDER — SODIUM CHLORIDE 0.9 % IV BOLUS (SEPSIS)
1000.0000 mL | Freq: Once | INTRAVENOUS | Status: AC
  Administered 2014-06-26: 1000 mL via INTRAVENOUS

## 2014-06-26 MED ORDER — MORPHINE SULFATE 4 MG/ML IJ SOLN
4.0000 mg | INTRAMUSCULAR | Status: DC | PRN
  Administered 2014-06-27 – 2014-06-29 (×4): 4 mg via INTRAVENOUS
  Filled 2014-06-26 (×4): qty 1

## 2014-06-26 MED ORDER — HEPARIN SODIUM (PORCINE) 5000 UNIT/ML IJ SOLN
5000.0000 [IU] | Freq: Three times a day (TID) | INTRAMUSCULAR | Status: AC
  Administered 2014-06-27 – 2014-06-28 (×6): 5000 [IU] via SUBCUTANEOUS
  Filled 2014-06-26 (×7): qty 1

## 2014-06-26 NOTE — ED Notes (Signed)
Pt presents from home via [facility] with c/o central upper abdominal pain radiating to the back. Pt has hx gallstones with associated pain and is scheduled for gall bladder removal on Friday.  Pt had 1 episode of emesis and received  IV Zofran PTA.  Pt is A&Ox4.

## 2014-06-26 NOTE — ED Provider Notes (Signed)
CSN: [id number]     Arrival date & time 06/26/14  1522 History   First MD Initiated Contact with Patient 06/26/14 1529     Chief Complaint  Patient presents with  . Abdominal Pain     (Consider location/radiation/quality/duration/timing/severity/associated sxs/prior Treatment) HPI 43 year old female with a history of symptomatic cholelithiasis presents with severe upper abdominal pain. She states that typically she gets pain once every 2 weeks or so and is scheduled for surgery 4 days from now. Today however she feels like she been having pain all day and is gradually worsened. She did notice that eating made it a little bit worse. The pain is now a severe 10 out of 10, mostly in her epigastrium. It radiates to her back. Similar pain to multiple prior abd pains from gallstones but worse. Has been sweating when pain gets bad, denies fevers. Has felt nauseous and vomited. No urinary symptoms. Pain hurts worse with inspiration.  Past Medical History  Diagnosis Date  . Gallstone    History reviewed. No pertinent past surgical history. Family History  Problem Relation Age of Onset  . Diabetes Mother   . Diabetes Father   . Diabetes Brother    History  Substance Use Topics  . Smoking status: Never Smoker   . Smokeless tobacco: Not on file  . Alcohol Use: No   OB History   Grav Para Term Preterm Abortions TAB SAB Ect Mult Living                 Review of Systems  Constitutional: Positive for diaphoresis. Negative for fever.  Respiratory: Negative for shortness of breath.   Gastrointestinal: Positive for nausea, vomiting and abdominal pain.  Genitourinary: Negative for dysuria and hematuria.  Musculoskeletal: Positive for back pain.  All other systems reviewed and are negative.     Allergies  Review of patient's allergies indicates no known allergies.  Home Medications   Prior to Admission medications   Medication Sig Start Date End Date Taking? Authorizing Provider   cyclobenzaprine (FLEXERIL) 10 MG tablet Take 1 tablet (10 mg total) by mouth at bedtime as needed for muscle spasms. 03/29/14   Sherren Mocha, MD  meloxicam (MOBIC) 15 MG tablet Take 1 tablet (15 mg total) by mouth daily as needed for pain. 03/29/14   Sherren Mocha, MD   BP 118/51  Temp(Src) 97.7 F (36.5 C) (Oral)  Resp 21  SpO2 98%  LMP 06/25/2014 Physical Exam  Nursing note and vitals reviewed. Constitutional: She is oriented to person, place, and time. She appears well-developed and well-nourished. No distress.  HENT:  Head: Normocephalic and atraumatic.  Right Ear: External ear normal.  Left Ear: External ear normal.  Nose: Nose normal.  Eyes: Right eye exhibits no discharge. Left eye exhibits no discharge.  Cardiovascular: Normal rate, regular rhythm and normal heart sounds.   Pulmonary/Chest: Effort normal and breath sounds normal.  Abdominal: Soft. There is tenderness in the right upper quadrant and epigastric area. There is no CVA tenderness.  Neurological: She is alert and oriented to person, place, and time.  Skin: Skin is warm and dry. She is not diaphoretic.    ED Course  Procedures (including critical care time) Labs Review Labs Reviewed  CBC WITH DIFFERENTIAL - Abnormal; Notable for the following:    WBC 12.7 (*)    Neutrophils Relative % 85 (*)    Neutro Abs 10.8 (*)    Lymphocytes Relative 6 (*)    Monocytes Absolute 1.1 (*)  All other components within normal limits  COMPREHENSIVE METABOLIC PANEL - Abnormal; Notable for the following:    Glucose, Bld 123 (*)    AST 946 (*)    ALT 620 (*)    Alkaline Phosphatase 173 (*)    All other components within normal limits  LIPASE, BLOOD - Abnormal; Notable for the following:    Lipase >3000 (*)    All other components within normal limits  URINALYSIS, ROUTINE W REFLEX MICROSCOPIC - Abnormal; Notable for the following:    Color, Urine AMBER (*)    APPearance HAZY (*)    Hgb urine dipstick LARGE (*)    Bilirubin  Urine SMALL (*)    Leukocytes, UA SMALL (*)    All other components within normal limits  URINE MICROSCOPIC-ADD ON - Abnormal; Notable for the following:    Squamous Epithelial / LPF FEW (*)    Bacteria, UA FEW (*)    Casts HYALINE CASTS (*)    All other components within normal limits  POC URINE PREG, ED    Imaging Review US Abdomen Limited  06/26/2014   CLINICAL DATA:  Right upper quadrant abdominal pain  EXAM: US ABDOMEN LIMITED - RIGHT UPPER QUADRANT  COMPARISON:  None.  FINDINGS: Gallbladder:  Multiple large calculi with the lumen of the gallbladder with posterior shadowing. No pericholecystic fluid. No gallbladder wall thickening. Negative sonographic Murphy's sign.  Common bile duct:  Diameter: Normal at 3 mm.  Liver:  No focal lesion identified. Within normal limits in parenchymal echogenicity.  IMPRESSION: Multiple large gallstones within within the gallbladder. No clear evidence of acute cholecystitis.   Electronically Signed   By: Genevive Bi M.D.   On: 06/26/2014 19:32     EKG Interpretation None      MDM   Final diagnoses:  Gallstone pancreatitis    Patient's work is consistent with gallstone pancreatitis. She has no fevers or obvious signs of cholecystitis. Her pain is controlled with IV morphine. Discussed with the surgeon on call, Carolynne Edouard, who will admit patient. States antibiotics are not needed at this time.    Audree Camel, MD 06/26/14 [phone]

## 2014-06-26 NOTE — H&P (Signed)
Renee [name] is an 43 y.o. female.   Chief Complaint: abdominal pain HPI: The patient is a 43 year old Hispanic female who recently saw Dr. Ninfa Linden for gallstones. She is scheduled for gallbladder surgery this Friday. Her pain worsened about 1:00 this afternoon. She has had significant nausea and vomiting. She returned to the emergency department where she was found to have significantly elevated liver enzymes and lipase  Past Medical History  Diagnosis Date  . Gallstone     History reviewed. No pertinent past surgical history.  Family History  Problem Relation Age of Onset  . Diabetes Mother   . Diabetes Father   . Diabetes Brother    Social History:  reports that she has never smoked. She does not have any smokeless tobacco history on file. She reports that she does not drink alcohol or use illicit drugs.  Allergies: No Known Allergies   (Not in a hospital admission)  Results for orders placed during the hospital encounter of 06/26/14 (from the past 48 hour(s))  CBC WITH DIFFERENTIAL     Status: Abnormal   Collection Time    06/26/14  3:49 PM      Result Value Ref Range   WBC 12.7 (*) 4.0 - 10.5 K/uL   RBC 4.54  3.87 - 5.11 MIL/uL   Hemoglobin 12.2  12.0 - 15.0 g/dL   HCT 36.2  36.0 - 46.0 %   MCV 79.7  78.0 - 100.0 fL   MCH 26.9  26.0 - 34.0 pg   MCHC 33.7  30.0 - 36.0 g/dL   RDW 14.2  11.5 - 15.5 %   Platelets 286  150 - 400 K/uL   Neutrophils Relative % 85 (*) 43 - 77 %   Neutro Abs 10.8 (*) 1.7 - 7.7 K/uL   Lymphocytes Relative 6 (*) 12 - 46 %   Lymphs Abs 0.8  0.7 - 4.0 K/uL   Monocytes Relative 9  3 - 12 %   Monocytes Absolute 1.1 (*) 0.1 - 1.0 K/uL   Eosinophils Relative 0  0 - 5 %   Eosinophils Absolute 0.0  0.0 - 0.7 K/uL   Basophils Relative 0  0 - 1 %   Basophils Absolute 0.0  0.0 - 0.1 K/uL  COMPREHENSIVE METABOLIC PANEL     Status: Abnormal   Collection Time    06/26/14  3:49 PM      Result Value Ref Range   Sodium 138  137 - 147 mEq/L   Potassium 4.4  3.7 - 5.3 mEq/L   Chloride 103  96 - 112 mEq/L   CO2 24  19 - 32 mEq/L   Glucose, Bld 123 (*) 70 - 99 mg/dL   BUN 10  6 - 23 mg/dL   Creatinine, Ser 0.58  0.50 - 1.10 mg/dL   Calcium 8.8  8.4 - 10.5 mg/dL   Total Protein 7.4  6.0 - 8.3 g/dL   Albumin 3.6  3.5 - 5.2 g/dL   AST 946 (*) 0 - 37 U/L   ALT 620 (*) 0 - 35 U/L   Alkaline Phosphatase 173 (*) 39 - 117 U/L   Total Bilirubin 0.8  0.3 - 1.2 mg/dL   GFR calc non Af Amer >90  >90 mL/min   GFR calc Af Amer >90  >90 mL/min   Comment: (NOTE)     The eGFR has been calculated using the CKD EPI equation.     This calculation has not been validated in all clinical  situations.     eGFR's persistently <90 mL/min signify possible Chronic Kidney     Disease.   Anion gap 11  5 - 15  LIPASE, BLOOD     Status: Abnormal   Collection Time    06/26/14  3:49 PM      Result Value Ref Range   Lipase >3000 (*) 11 - 59 U/L  URINALYSIS, ROUTINE W REFLEX MICROSCOPIC     Status: Abnormal   Collection Time    06/26/14  5:17 PM      Result Value Ref Range   Color, Urine AMBER (*) YELLOW   Comment: BIOCHEMICALS MAY BE AFFECTED BY COLOR   APPearance HAZY (*) CLEAR   Specific Gravity, Urine 1.021  1.005 - 1.030   pH 5.5  5.0 - 8.0   Glucose, UA NEGATIVE  NEGATIVE mg/dL   Hgb urine dipstick LARGE (*) NEGATIVE   Bilirubin Urine SMALL (*) NEGATIVE   Ketones, ur NEGATIVE  NEGATIVE mg/dL   Protein, ur NEGATIVE  NEGATIVE mg/dL   Urobilinogen, UA 1.0  0.0 - 1.0 mg/dL   Nitrite NEGATIVE  NEGATIVE   Leukocytes, UA SMALL (*) NEGATIVE  URINE MICROSCOPIC-ADD ON     Status: Abnormal   Collection Time    06/26/14  5:17 PM      Result Value Ref Range   Squamous Epithelial / LPF FEW (*) RARE   WBC, UA 3-6  <3 WBC/hpf   RBC / HPF TOO NUMEROUS TO COUNT  <3 RBC/hpf   Bacteria, UA FEW (*) RARE   Casts HYALINE CASTS (*) NEGATIVE  POC URINE PREG, ED     Status: None   Collection Time    06/26/14  5:46 PM      Result Value Ref Range   Preg Test,  Ur NEGATIVE  NEGATIVE   Comment:            THE SENSITIVITY OF THIS     METHODOLOGY IS >24 mIU/mL   US Abdomen Limited  06/26/2014   CLINICAL DATA:  Right upper quadrant abdominal pain  EXAM: US ABDOMEN LIMITED - RIGHT UPPER QUADRANT  COMPARISON:  None.  FINDINGS: Gallbladder:  Multiple large calculi with the lumen of the gallbladder with posterior shadowing. No pericholecystic fluid. No gallbladder wall thickening. Negative sonographic Murphy's sign.  Common bile duct:  Diameter: Normal at 3 mm.  Liver:  No focal lesion identified. Within normal limits in parenchymal echogenicity.  IMPRESSION: Multiple large gallstones within within the gallbladder. No clear evidence of acute cholecystitis.   Electronically Signed   By: Suzy Bouchard M.D.   On: 06/26/2014 19:32    Review of Systems  Constitutional: Negative.   HENT: Negative.   Eyes: Negative.   Respiratory: Negative.   Cardiovascular: Negative.   Gastrointestinal: Positive for nausea, vomiting and abdominal pain.  Genitourinary: Negative.   Musculoskeletal: Negative.   Skin: Negative.   Neurological: Negative.   Endo/Heme/Allergies: Negative.   Psychiatric/Behavioral: Negative.     Blood pressure 109/59, pulse 68, temperature 97.7 F (36.5 C), temperature source Oral, resp. rate 17, last menstrual period 06/25/2014, SpO2 99.00%. Physical Exam  Constitutional: She is oriented to person, place, and time. She appears well-developed and well-nourished.  HENT:  Head: Normocephalic and atraumatic.  Eyes: Conjunctivae and EOM are normal. Pupils are equal, round, and reactive to light.  Neck: Normal range of motion. Neck supple.  Cardiovascular: Normal rate, regular rhythm and normal heart sounds.   Respiratory: Effort normal and breath sounds normal.  GI: Soft. Bowel sounds are normal.  There is central and RUQ tenderness. No guarding  Musculoskeletal: Normal range of motion.  Neurological: She is alert and oriented to person,  place, and time.  Skin: Skin is warm and dry.  Psychiatric: She has a normal mood and affect. Her behavior is normal.     Assessment/Plan The patient appears to have developed gallstone pancreatitis. At this point I would recommend admission to the hospital and bowel rest. Once her liver functions and lipase returned to normal then she would probably benefit from having her gallbladder removed during this admission. If her liver functions do not improve then she may need a GI consult and possible ERCP  TOTH III,Shikara Mcauliffe S 06/26/2014, 9:05 PM

## 2014-06-27 ENCOUNTER — Encounter (HOSPITAL_COMMUNITY): Payer: Self-pay | Admitting: General Practice

## 2014-06-27 LAB — COMPREHENSIVE METABOLIC PANEL
ALT: 470 U/L — ABNORMAL HIGH (ref 0–35)
ANION GAP: 12 (ref 5–15)
AST: 337 U/L — ABNORMAL HIGH (ref 0–37)
Albumin: 3.4 g/dL — ABNORMAL LOW (ref 3.5–5.2)
Alkaline Phosphatase: 175 U/L — ABNORMAL HIGH (ref 39–117)
BUN: 11 mg/dL (ref 6–23)
CALCIUM: 8.4 mg/dL (ref 8.4–10.5)
CO2: 22 mEq/L (ref 19–32)
Chloride: 106 mEq/L (ref 96–112)
Creatinine, Ser: 0.62 mg/dL (ref 0.50–1.10)
GFR calc non Af Amer: >90 mL/min (ref >90)
GLUCOSE: 145 mg/dL — AB (ref 70–99)
Potassium: 4.4 mEq/L (ref 3.7–5.3)
Sodium: 140 mEq/L (ref 137–147)
TOTAL PROTEIN: 7 g/dL (ref 6.0–8.3)
Total Bilirubin: 0.7 mg/dL (ref 0.3–1.2)

## 2014-06-27 LAB — CBC
HCT: 34.7 % — ABNORMAL LOW (ref 36.0–46.0)
HEMOGLOBIN: 11.6 g/dL — AB (ref 12.0–15.0)
MCH: 26.5 pg (ref 26.0–34.0)
MCHC: 33.4 g/dL (ref 30.0–36.0)
MCV: 79.4 fL (ref 78.0–100.0)
PLATELETS: 292 10*3/uL (ref 150–400)
RBC: 4.37 MIL/uL (ref 3.87–5.11)
RDW: 14.3 % (ref 11.5–15.5)
WBC: 10.6 10*3/uL — ABNORMAL HIGH (ref 4.0–10.5)

## 2014-06-27 LAB — LIPASE, BLOOD: LIPASE: 1206 U/L — AB (ref 11–59)

## 2014-06-27 NOTE — Progress Notes (Signed)
Mild epigastric tenderness. Await improvement in pancreatitis. Notified Dr. Magnus IvanBlackman. Patient examined and I agree with the assessment and plan  Violeta GelinasBurke Karsyn Rochin, MD, MPH, FACS Trauma: [phone] General Surgery: [phone]  06/27/2014 10:08 AM

## 2014-06-27 NOTE — Progress Notes (Signed)
Patient ID: Renee [name], female   DOB: 12-10-1970, 43 y.o.   MRN: [id number]    Subjective: Feels better today than last night, but still with some right sided abdominal pain.  No nausea  Objective: Vital signs in last 24 hours: Temp:  [97.7 F (36.5 C)-98.2 F (36.8 C)] 98.2 F (36.8 C) (09/29 0534) Pulse Rate:  [64-73] 72 (09/29 0534) Resp:  [12-22] 17 (09/29 0534) BP: (103-127)/(51-75) 116/66 mmHg (09/29 0534) SpO2:  [97 %-100 %] 97 % (09/29 0534) Weight:  [149 lb 11.2 oz (67.903 kg)] 149 lb 11.2 oz (67.903 kg) (09/28 2211) Last BM Date: 06/26/14  Intake/Output from previous day:   Intake/Output this shift:    PE: Abd: soft, tender in RUQ, no left sided tenderness, +BS, ND Heart: regular Lungs: CTAB  Lab Results:   Recent Labs  06/26/14 1549 06/27/14 0510  WBC 12.7* 10.6*  HGB 12.2 11.6*  HCT 36.2 34.7*  PLT 286 292   BMET  Recent Labs  06/26/14 1549 06/27/14 0510  NA 138 140  K 4.4 4.4  CL 103 106  CO2 24 22  GLUCOSE 123* 145*  BUN 10 11  CREATININE 0.58 0.62  CALCIUM 8.8 8.4   PT/INR No results found for this basename: LABPROT, INR,  in the last 72 hours CMP     Component Value Date/Time   NA 140 06/27/2014 0510   K 4.4 06/27/2014 0510   CL 106 06/27/2014 0510   CO2 22 06/27/2014 0510   GLUCOSE 145* 06/27/2014 0510   BUN 11 06/27/2014 0510   CREATININE 0.62 06/27/2014 0510   CREATININE 0.69 03/15/2014 1647   CALCIUM 8.4 06/27/2014 0510   PROT 7.0 06/27/2014 0510   ALBUMIN 3.4* 06/27/2014 0510   AST 337* 06/27/2014 0510   ALT 470* 06/27/2014 0510   ALKPHOS 175* 06/27/2014 0510   BILITOT 0.7 06/27/2014 0510   GFRNONAA >90 06/27/2014 0510   GFRAA >90 06/27/2014 0510   Lipase     Component Value Date/Time   LIPASE 1206* 06/27/2014 0510       Studies/Results: [place] Abdomen Limited  06/26/2014   CLINICAL DATA:  Right upper quadrant abdominal pain  EXAM: US ABDOMEN LIMITED - RIGHT UPPER QUADRANT  COMPARISON:  None.  FINDINGS: Gallbladder:  Multiple  large calculi with the lumen of the gallbladder with posterior shadowing. No pericholecystic fluid. No gallbladder wall thickening. Negative sonographic Murphy's sign.  Common bile duct:  Diameter: Normal at 3 mm.  Liver:  No focal lesion identified. Within normal limits in parenchymal echogenicity.  IMPRESSION: Multiple large gallstones within within the gallbladder. No clear evidence of acute cholecystitis.   Electronically Signed   By: Genevive BiStewart  Edmunds M.D.   On: 06/26/2014 19:32    Anti-infectives: Anti-infectives   None       Assessment/Plan  1. Gallstone pancreatitis  Plan: 1. Await resolution of pancreatitis prior to proceeding with a lap chole.  Repeat films in the morning. 2. On SQ heparin  3. Cont NPO for now and IVFs  LOS: 1 day    Torey Reinard E 06/27/2014, 9:21 AM Pager: [phone]

## 2014-06-28 LAB — COMPREHENSIVE METABOLIC PANEL
ALK PHOS: 132 U/L — AB (ref 39–117)
ALT: 256 U/L — AB (ref 0–35)
AST: 84 U/L — ABNORMAL HIGH (ref 0–37)
Albumin: 3.1 g/dL — ABNORMAL LOW (ref 3.5–5.2)
Anion gap: 9 (ref 5–15)
BUN: 5 mg/dL — ABNORMAL LOW (ref 6–23)
CHLORIDE: 105 meq/L (ref 96–112)
CO2: 23 meq/L (ref 19–32)
Calcium: 8.4 mg/dL (ref 8.4–10.5)
Creatinine, Ser: 0.54 mg/dL (ref 0.50–1.10)
GFR calc non Af Amer: >90 mL/min (ref >90)
GLUCOSE: 116 mg/dL — AB (ref 70–99)
POTASSIUM: 4.2 meq/L (ref 3.7–5.3)
SODIUM: 137 meq/L (ref 137–147)
TOTAL PROTEIN: 6.7 g/dL (ref 6.0–8.3)
Total Bilirubin: 0.5 mg/dL (ref 0.3–1.2)

## 2014-06-28 LAB — SURGICAL PCR SCREEN
MRSA, PCR: NEGATIVE
Staphylococcus aureus: NEGATIVE

## 2014-06-28 LAB — CBC
HCT: 32 % — ABNORMAL LOW (ref 36.0–46.0)
HEMOGLOBIN: 10.5 g/dL — AB (ref 12.0–15.0)
MCH: 26.1 pg (ref 26.0–34.0)
MCHC: 32.8 g/dL (ref 30.0–36.0)
MCV: 79.6 fL (ref 78.0–100.0)
PLATELETS: 265 10*3/uL (ref 150–400)
RBC: 4.02 MIL/uL (ref 3.87–5.11)
RDW: 14.5 % (ref 11.5–15.5)
WBC: 10.1 10*3/uL (ref 4.0–10.5)

## 2014-06-28 LAB — LIPASE, BLOOD: Lipase: 223 U/L — ABNORMAL HIGH (ref 11–59)

## 2014-06-28 MED ORDER — CEFAZOLIN SODIUM-DEXTROSE 2-3 GM-% IV SOLR
2.0000 g | INTRAVENOUS | Status: AC
  Administered 2014-06-29: 2 g via INTRAVENOUS
  Filled 2014-06-28: qty 50

## 2014-06-28 MED ORDER — CIPROFLOXACIN IN D5W 400 MG/200ML IV SOLN
400.0000 mg | Freq: Two times a day (BID) | INTRAVENOUS | Status: DC
  Administered 2014-06-28 – 2014-06-29 (×2): 400 mg via INTRAVENOUS
  Filled 2014-06-28 (×3): qty 200

## 2014-06-28 NOTE — Progress Notes (Signed)
Patient ID: Renee [name], female   DOB: 20-Jul-1971, 43 y.o.   MRN: [id number]    Subjective: Pt feels well today.  Less pain.  No nausea  Objective: Vital signs in last 24 hours: Temp:  [98 F (36.7 C)-100.2 F (37.9 C)] 98.8 F (37.1 C) (09/30 0558) Pulse Rate:  [68-82] 82 (09/30 0558) Resp:  [16-17] 17 (09/30 0558) BP: (100-112)/(44-64) 102/50 mmHg (09/30 0558) SpO2:  [98 %-99 %] 98 % (09/30 0558) Last BM Date: 06/26/14  Intake/Output from previous day:   Intake/Output this shift:    PE: Abd: soft, minimally tender in epigastrium, +BS, ND Heart: regular Lungs: CTAB  Lab Results:   Recent Labs  06/27/14 0510 06/28/14 0413  WBC 10.6* 10.1  HGB 11.6* 10.5*  HCT 34.7* 32.0*  PLT 292 265   BMET  Recent Labs  06/27/14 0510 06/28/14 0413  NA 140 137  K 4.4 4.2  CL 106 105  CO2 22 23  GLUCOSE 145* 116*  BUN 11 5*  CREATININE 0.62 0.54  CALCIUM 8.4 8.4   PT/INR No results found for this basename: LABPROT, INR,  in the last 72 hours CMP     Component Value Date/Time   NA 137 06/28/2014 0413   K 4.2 06/28/2014 0413   CL 105 06/28/2014 0413   CO2 23 06/28/2014 0413   GLUCOSE 116* 06/28/2014 0413   BUN 5* 06/28/2014 0413   CREATININE 0.54 06/28/2014 0413   CREATININE 0.69 03/15/2014 1647   CALCIUM 8.4 06/28/2014 0413   PROT 6.7 06/28/2014 0413   ALBUMIN 3.1* 06/28/2014 0413   AST 84* 06/28/2014 0413   ALT 256* 06/28/2014 0413   ALKPHOS 132* 06/28/2014 0413   BILITOT 0.5 06/28/2014 0413   GFRNONAA >90 06/28/2014 0413   GFRAA >90 06/28/2014 0413   Lipase     Component Value Date/Time   LIPASE 223* 06/28/2014 0413       Studies/Results: [place] Abdomen Limited  06/26/2014   CLINICAL DATA:  Right upper quadrant abdominal pain  EXAM: US ABDOMEN LIMITED - RIGHT UPPER QUADRANT  COMPARISON:  None.  FINDINGS: Gallbladder:  Multiple large calculi with the lumen of the gallbladder with posterior shadowing. No pericholecystic fluid. No gallbladder wall thickening. Negative  sonographic Murphy's sign.  Common bile duct:  Diameter: Normal at 3 mm.  Liver:  No focal lesion identified. Within normal limits in parenchymal echogenicity.  IMPRESSION: Multiple large gallstones within within the gallbladder. No clear evidence of acute cholecystitis.   Electronically Signed   By: Genevive BiStewart  Edmunds M.D.   On: 06/26/2014 19:32    Anti-infectives: Anti-infectives   None       Assessment/Plan  1. Gallstone pancreatitis  Plan: 1. Lipase still 223.  Would like this to be closer to normal prior to OR.  Hopefully, ready for OR tomorrow.  Will allow sips of clears from the floor today, then NPO p MN.   Repeat labs in the am.  If ok, will proceed with OR tomorrow.   LOS: 2 days    Marnie Fazzino E 06/28/2014, 8:36 AM Pager: [phone]

## 2014-06-28 NOTE — Progress Notes (Signed)
Pancreatitis improving. If lipase down further in AM, will proceed with laparoscopic cholecystectomy with IOC tomorrow. Procedure, risks, and benefits D/W her and she agrees. Patient examined and I agree with the assessment and plan  Violeta GelinasBurke Allyn Bertoni, MD, MPH, FACS Trauma: [phone] General Surgery: [phone]  06/28/2014 12:49 PM

## 2014-06-29 ENCOUNTER — Inpatient Hospital Stay (HOSPITAL_COMMUNITY): Payer: 59 | Admitting: Anesthesiology

## 2014-06-29 ENCOUNTER — Encounter (HOSPITAL_COMMUNITY): Admission: EM | Disposition: A | Discharge status: Home / Self Care | Payer: Self-pay | Source: Home / Self Care

## 2014-06-29 ENCOUNTER — Inpatient Hospital Stay (HOSPITAL_COMMUNITY): Payer: 59

## 2014-06-29 ENCOUNTER — Encounter (HOSPITAL_COMMUNITY): Payer: 59 | Admitting: Anesthesiology

## 2014-06-29 HISTORY — PX: CHOLECYSTECTOMY: SHX55

## 2014-06-29 LAB — COMPREHENSIVE METABOLIC PANEL
ALK PHOS: 118 U/L — AB (ref 39–117)
ALT: 157 U/L — ABNORMAL HIGH (ref 0–35)
ANION GAP: 10 (ref 5–15)
AST: 34 U/L (ref 0–37)
Albumin: 2.8 g/dL — ABNORMAL LOW (ref 3.5–5.2)
BILIRUBIN TOTAL: 0.5 mg/dL (ref 0.3–1.2)
BUN: 5 mg/dL — AB (ref 6–23)
CHLORIDE: 108 meq/L (ref 96–112)
CO2: 23 mEq/L (ref 19–32)
Calcium: 8.5 mg/dL (ref 8.4–10.5)
Creatinine, Ser: 0.58 mg/dL (ref 0.50–1.10)
GLUCOSE: 107 mg/dL — AB (ref 70–99)
Potassium: 4.1 mEq/L (ref 3.7–5.3)
Sodium: 141 mEq/L (ref 137–147)
Total Protein: 6.8 g/dL (ref 6.0–8.3)

## 2014-06-29 LAB — LIPASE, BLOOD: Lipase: 113 U/L — ABNORMAL HIGH (ref 11–59)

## 2014-06-29 SURGERY — LAPAROSCOPIC CHOLECYSTECTOMY WITH INTRAOPERATIVE CHOLANGIOGRAM
Anesthesia: General | Site: Abdomen

## 2014-06-29 MED ORDER — OXYCODONE HCL 5 MG PO TABS
5.0000 mg | ORAL_TABLET | Freq: Once | ORAL | Status: AC | PRN
  Administered 2014-06-29: 5 mg via ORAL

## 2014-06-29 MED ORDER — PROPOFOL 10 MG/ML IV BOLUS
INTRAVENOUS | Status: AC
  Filled 2014-06-29: qty 20

## 2014-06-29 MED ORDER — SODIUM CHLORIDE 0.9 % IR SOLN
Status: DC | PRN
  Administered 2014-06-29: 1000 mL

## 2014-06-29 MED ORDER — OXYCODONE HCL 5 MG/5ML PO SOLN: 5.0000 mg | Freq: Once | ORAL | Status: AC | PRN

## 2014-06-29 MED ORDER — SCOPOLAMINE 1 MG/3DAYS TD PT72
MEDICATED_PATCH | TRANSDERMAL | Status: AC
  Filled 2014-06-29: qty 1

## 2014-06-29 MED ORDER — BUPIVACAINE-EPINEPHRINE (PF) 0.25% -1:200000 IJ SOLN
INTRAMUSCULAR | Status: AC
  Filled 2014-06-29: qty 30

## 2014-06-29 MED ORDER — PROPOFOL 10 MG/ML IV BOLUS
INTRAVENOUS | Status: DC | PRN
  Administered 2014-06-29: 140 mg via INTRAVENOUS

## 2014-06-29 MED ORDER — KCL IN DEXTROSE-NACL 20-5-0.9 MEQ/L-%-% IV SOLN
INTRAVENOUS | Status: DC
  Administered 2014-06-29: 13:00:00 via INTRAVENOUS
  Filled 2014-06-29 (×2): qty 1000

## 2014-06-29 MED ORDER — OXYCODONE HCL 5 MG PO TABS
ORAL_TABLET | ORAL | Status: AC
  Filled 2014-06-29: qty 1

## 2014-06-29 MED ORDER — MIDAZOLAM HCL 2 MG/2ML IJ SOLN
INTRAMUSCULAR | Status: AC
  Filled 2014-06-29: qty 2

## 2014-06-29 MED ORDER — GLYCOPYRROLATE 0.2 MG/ML IJ SOLN
INTRAMUSCULAR | Status: DC | PRN
  Administered 2014-06-29: .6 mg via INTRAVENOUS

## 2014-06-29 MED ORDER — HYDROMORPHONE HCL 1 MG/ML IJ SOLN: 0.2500 mg | INTRAMUSCULAR | Status: DC | PRN

## 2014-06-29 MED ORDER — ROCURONIUM BROMIDE 100 MG/10ML IV SOLN
INTRAVENOUS | Status: DC | PRN
  Administered 2014-06-29: 40 mg via INTRAVENOUS

## 2014-06-29 MED ORDER — FENTANYL CITRATE 0.05 MG/ML IJ SOLN
INTRAMUSCULAR | Status: AC
  Filled 2014-06-29: qty 5

## 2014-06-29 MED ORDER — FENTANYL CITRATE 0.05 MG/ML IJ SOLN
INTRAMUSCULAR | Status: DC | PRN
  Administered 2014-06-29: 50 ug via INTRAVENOUS
  Administered 2014-06-29: 100 ug via INTRAVENOUS

## 2014-06-29 MED ORDER — LIDOCAINE HCL (CARDIAC) 20 MG/ML IV SOLN
INTRAVENOUS | Status: DC | PRN
  Administered 2014-06-29: 60 mg via INTRAVENOUS

## 2014-06-29 MED ORDER — ONDANSETRON HCL 4 MG/2ML IJ SOLN
INTRAMUSCULAR | Status: DC | PRN
  Administered 2014-06-29: 4 mg via INTRAVENOUS

## 2014-06-29 MED ORDER — DEXAMETHASONE SODIUM PHOSPHATE 10 MG/ML IJ SOLN
INTRAMUSCULAR | Status: DC | PRN
  Administered 2014-06-29: 8 mg via INTRAVENOUS

## 2014-06-29 MED ORDER — OXYCODONE HCL 5 MG PO TABS
5.0000 mg | ORAL_TABLET | ORAL | Status: DC | PRN
  Administered 2014-06-29 – 2014-06-30 (×2): 5 mg via ORAL
  Filled 2014-06-29 (×2): qty 1

## 2014-06-29 MED ORDER — LACTATED RINGERS IV SOLN
INTRAVENOUS | Status: DC | PRN
  Administered 2014-06-29 (×2): via INTRAVENOUS

## 2014-06-29 MED ORDER — 0.9 % SODIUM CHLORIDE (POUR BTL) OPTIME
TOPICAL | Status: DC | PRN
  Administered 2014-06-29: 1000 mL

## 2014-06-29 MED ORDER — BUPIVACAINE-EPINEPHRINE 0.25% -1:200000 IJ SOLN
INTRAMUSCULAR | Status: DC | PRN
  Administered 2014-06-29: 20 mL

## 2014-06-29 MED ORDER — NEOSTIGMINE METHYLSULFATE 10 MG/10ML IV SOLN
INTRAVENOUS | Status: DC | PRN
  Administered 2014-06-29: 4 mg via INTRAVENOUS

## 2014-06-29 MED ORDER — MIDAZOLAM HCL 5 MG/5ML IJ SOLN
INTRAMUSCULAR | Status: DC | PRN
  Administered 2014-06-29: 2 mg via INTRAVENOUS

## 2014-06-29 MED ORDER — PHENYLEPHRINE HCL 10 MG/ML IJ SOLN
INTRAMUSCULAR | Status: DC | PRN
  Administered 2014-06-29: 120 ug via INTRAVENOUS
  Administered 2014-06-29: 80 ug via INTRAVENOUS

## 2014-06-29 MED ORDER — SODIUM CHLORIDE 0.9 % IV SOLN
INTRAVENOUS | Status: DC | PRN
  Administered 2014-06-29: 10:00:00

## 2014-06-29 SURGICAL SUPPLY — 48 items
APPLIER CLIP 5 13 M/L LIGAMAX5 (MISCELLANEOUS) ×3
BLADE SURG ROTATE 9660 (MISCELLANEOUS) IMPLANT
CANISTER SUCTION 2500CC (MISCELLANEOUS) ×3 IMPLANT
CHLORAPREP W/TINT 26ML (MISCELLANEOUS) ×3 IMPLANT
CLIP APPLIE 5 13 M/L LIGAMAX5 (MISCELLANEOUS) ×1 IMPLANT
COVER MAYO STAND STRL (DRAPES) ×3 IMPLANT
COVER SURGICAL LIGHT HANDLE (MISCELLANEOUS) ×3 IMPLANT
DERMABOND ADVANCED (GAUZE/BANDAGES/DRESSINGS) ×2
DERMABOND ADVANCED .7 DNX12 (GAUZE/BANDAGES/DRESSINGS) ×1 IMPLANT
DRAPE C-ARM 42X72 X-RAY (DRAPES) ×3 IMPLANT
DRAPE UTILITY 15X26 W/TAPE STR (DRAPE) IMPLANT
ELECT REM PT RETURN 9FT ADLT (ELECTROSURGICAL) ×3
ELECTRODE REM PT RTRN 9FT ADLT (ELECTROSURGICAL) ×1 IMPLANT
FILTER SMOKE EVAC LAPAROSHD (FILTER) IMPLANT
GLOVE BIO SURGEON STRL SZ 6.5 (GLOVE) ×2 IMPLANT
GLOVE BIO SURGEON STRL SZ8 (GLOVE) ×3 IMPLANT
GLOVE BIO SURGEONS STRL SZ 6.5 (GLOVE) ×1
GLOVE BIOGEL PI IND STRL 6.5 (GLOVE) ×1 IMPLANT
GLOVE BIOGEL PI IND STRL 7.0 (GLOVE) ×3 IMPLANT
GLOVE BIOGEL PI IND STRL 8 (GLOVE) ×1 IMPLANT
GLOVE BIOGEL PI INDICATOR 6.5 (GLOVE) ×2
GLOVE BIOGEL PI INDICATOR 7.0 (GLOVE) ×6
GLOVE BIOGEL PI INDICATOR 8 (GLOVE) ×2
GLOVE SURG SS PI 7.0 STRL IVOR (GLOVE) ×6 IMPLANT
GOWN STRL REUS W/ TWL LRG LVL3 (GOWN DISPOSABLE) ×3 IMPLANT
GOWN STRL REUS W/ TWL XL LVL3 (GOWN DISPOSABLE) ×1 IMPLANT
GOWN STRL REUS W/TWL LRG LVL3 (GOWN DISPOSABLE) ×6
GOWN STRL REUS W/TWL XL LVL3 (GOWN DISPOSABLE) ×2
KIT BASIN OR (CUSTOM PROCEDURE TRAY) ×3 IMPLANT
KIT ROOM TURNOVER OR (KITS) ×3 IMPLANT
NEEDLE 22X1 1/2 (OR ONLY) (NEEDLE) IMPLANT
NS IRRIG 1000ML POUR BTL (IV SOLUTION) ×3 IMPLANT
PAD ARMBOARD 7.5X6 YLW CONV (MISCELLANEOUS) ×3 IMPLANT
POUCH RETRIEVAL ECOSAC 10 (ENDOMECHANICALS) ×1 IMPLANT
POUCH RETRIEVAL ECOSAC 10MM (ENDOMECHANICALS) ×2
SCISSORS LAP 5X35 DISP (ENDOMECHANICALS) ×3 IMPLANT
SET CHOLANGIOGRAPH 5 50 .035 (SET/KITS/TRAYS/PACK) ×3 IMPLANT
SET IRRIG TUBING LAPAROSCOPIC (IRRIGATION / IRRIGATOR) ×3 IMPLANT
SLEEVE ENDOPATH XCEL 5M (ENDOMECHANICALS) ×6 IMPLANT
SPECIMEN JAR SMALL (MISCELLANEOUS) ×3 IMPLANT
SUT VIC AB 4-0 PS2 27 (SUTURE) ×3 IMPLANT
SUT VICRYL 0 UR6 27IN ABS (SUTURE) ×3 IMPLANT
TOWEL OR 17X24 6PK STRL BLUE (TOWEL DISPOSABLE) IMPLANT
TOWEL OR 17X26 10 PK STRL BLUE (TOWEL DISPOSABLE) ×3 IMPLANT
TRAY LAPAROSCOPIC (CUSTOM PROCEDURE TRAY) ×3 IMPLANT
TROCAR XCEL BLUNT TIP 100MML (ENDOMECHANICALS) ×3 IMPLANT
TROCAR XCEL NON-BLD 5MMX100MML (ENDOMECHANICALS) ×3 IMPLANT
TUBING INSUFFLATION (TUBING) ×3 IMPLANT

## 2014-06-29 NOTE — Op Note (Signed)
06/26/2014 - 06/29/2014  10:00 AM  PATIENT:  Renee [name]  43 y.o. female  PRE-OPERATIVE DIAGNOSIS:  Biliary pancreatitis  POST-OPERATIVE DIAGNOSIS:  Biliary pancreatitis  PROCEDURE:  Procedure(s): LAPAROSCOPIC CHOLECYSTECTOMY WITH INTRAOPERATIVE CHOLANGIOGRAM  SURGEON:  Surgeon(s): Violeta GelinasBurke Romell Cavanah, MD  ASSISTANTS: none   ANESTHESIA:   local and general  EBL:  Total I/O In: 1350 [I.V.:1350] Out: -   BLOOD ADMINISTERED:none  DRAINS: none   SPECIMEN:  Excision  DISPOSITION OF SPECIMEN:  PATHOLOGY  COUNTS:  YES  DICTATION: .Dragon Dictation Patient was admitted with biliary pancreatitis. Her pancreatitis has improved. She is brought for cholecystectomy. She was sent from the preop holding area. Informed consent was obtained. She received intravenous antibiotics. She was brought to the operating room and general endotracheal anesthesia was administered by the anesthesia staff. Abdomen was prepped and draped in a sterile fashion after shielding pelvis. Time out procedure was performed. Infraumbilical region was infiltrated with local. Infraumbilical incision was made. Subcutaneous tissues were dissected down revealing the anterior fascia. This was divided sharply along the midline and the peritoneal cavity was entered under direct vision.0 Vicryl pursestring was placed on the fascial opening. Hassan trocar was inserted. Abdomen was insufflated with carbon dioxide in standard fashion. Under direct vision, a 5 mm epigastric and 5 mm right lateral ports x2 were placed. Local was used at each port site. Abdomen the gallbladder was retracted superior medially. Filmy omental adhesions were adherent to the body and infundibulum of the gallbladder. These were gently dissected off achieving good hemostasis. The infundibulum was then retracted inferolaterally. Dissection began laterally and progressed medially easily identifying the cystic duct and an anterior branch of the cystic artery.  Dissection continued until critical view was obtained between the infundibulum, the cystic duct, and the liver. Once we had excellent visualization, a clip was placed on the infundibular cystic duct junction. Small nick was made in the cystic duct and cholangiogram catheter was inserted. Intraoperative cholangiogram was obtained demonstrating no common bile duct filling defects and good flow of contrast into the duodenum. Cholangiocatheter was removed and 3 clips were placed proximally on the cystic duct. It was then divided. 2 clips were placed proximally and one distally on intravenous cystic artery and it was divided. Further dissection revealed a posterior branch of the cystic artery. This was clipped twice proximally and divided distally with cautery. Gallbladder was taken off the liver bed with cautery getting excellent hemostasis. Gallbladder was placed in an ecosac and removed from the abdomen via the infraumbilical port site. Liver bed was irrigated and was dry. He was no bleeding. Clips remain in good position. Irrigation fluid returned clear. Ports were removed under direct vision. Pneumoperitoneum was released. The umbilical fascia was closed by tying the pursestring suture. All 4 wounds were copiously irrigated and the skin of each was closed with running 4 Vicryl subcuticular followed by Dermabond. All counts were correct and the patient tolerated procedure well without apparent complication. She was taken recovery in stable condition.  PATIENT DISPOSITION:  PACU - hemodynamically stable.   Delay start of Pharmacological VTE agent (>24hrs) due to surgical blood loss or risk of bleeding:  no  Violeta GelinasBurke Keyani Rigdon, MD, MPH, FACS Pager: [phone]  10/1/201510:00 AM

## 2014-06-29 NOTE — Transfer of Care (Signed)
Immediate Anesthesia Transfer of Care Note  Patient: Renee [name]  Procedure(s) Performed: Procedure(s): LAPAROSCOPIC CHOLECYSTECTOMY WITH INTRAOPERATIVE CHOLANGIOGRAM (N/A)  Patient Location: PACU  Anesthesia Type:General  Level of Consciousness: awake, alert  and oriented  Airway & Oxygen Therapy: Patient Spontanous Breathing and Patient connected to nasal cannula oxygen  Post-op Assessment: Report given to PACU RN, Post -op Vital signs reviewed and stable and Patient moving all extremities X 4  Post vital signs: Reviewed and stable  Complications: No apparent anesthesia complications

## 2014-06-29 NOTE — Anesthesia Procedure Notes (Signed)
Procedure Name: Intubation Date/Time: 06/29/2014 8:53 AM Performed by: Carmela RimaMARTINELLI, Hideo Googe F Pre-anesthesia Checklist: Patient identified, Timeout performed, Emergency Drugs available, Suction available and Patient being monitored Patient Re-evaluated:Patient Re-evaluated prior to inductionOxygen Delivery Method: Circle system utilized Preoxygenation: Pre-oxygenation with 100% oxygen Intubation Type: IV induction Ventilation: Mask ventilation without difficulty Laryngoscope Size: Mac and 3 Grade View: Grade IV Tube type: Oral Tube size: 7.0 mm Number of attempts: 2 Airway Equipment and Method: Video-laryngoscopy Placement Confirmation: ETT inserted through vocal cords under direct vision,  positive ETCO2 and breath sounds checked- equal and bilateral Secured at: 21 cm Tube secured with: Tape Dental Injury: Teeth and Oropharynx as per pre-operative assessment  Difficulty Due To: Difficulty was unanticipated and Difficult Airway- due to limited oral opening

## 2014-06-29 NOTE — Progress Notes (Signed)
Day of Surgery  Subjective: Pain resolved  Objective: Vital signs in last 24 hours: Temp:  [98.7 F (37.1 C)-99 F (37.2 C)] 98.7 F (37.1 C) (10/01 0622) Pulse Rate:  [77-87] 77 (10/01 0622) Resp:  [15-17] 16 (10/01 0622) BP: (109-120)/(55-58) 109/57 mmHg (10/01 0622) SpO2:  [97 %-100 %] 100 % (10/01 0622) Last BM Date: 06/26/14  Intake/Output from previous day: 09/30 0701 - 10/01 0700 In: 1424 [P.O.:324; I.V.:1100] Out: -  Intake/Output this shift:    Resp: clear to auscultation bilaterally Cardio: regular rate and rhythm GI: soft, NT  Lab Results:   Recent Labs  06/27/14 0510 06/28/14 0413  WBC 10.6* 10.1  HGB 11.6* 10.5*  HCT 34.7* 32.0*  PLT 292 265   BMET  Recent Labs  06/28/14 0413 06/29/14 0530  NA 137 141  K 4.2 4.1  CL 105 108  CO2 23 23  GLUCOSE 116* 107*  BUN 5* 5*  CREATININE 0.54 0.58  CALCIUM 8.4 8.5   PT/INR No results found for this basename: LABPROT, INR,  in the last 72 hours ABG No results found for this basename: PHART, PCO2, PO2, HCO3,  in the last 72 hours  Studies/Results: No results found.  Anti-infectives: Anti-infectives   Start     Dose/Rate Route Frequency Ordered Stop   06/29/14 0600  ceFAZolin (ANCEF) IVPB 2 g/50 mL premix     2 g 100 mL/hr over 30 Minutes Intravenous On call to O.R. 06/28/14 1415 06/30/14 0559   06/28/14 1500  ciprofloxacin (CIPRO) IVPB 400 mg     400 mg 200 mL/hr over 60 Minutes Intravenous Every 12 hours 06/28/14 1251        Assessment/Plan: Biliary pancreatitis - lipase improved again. Pain resolved. For lap chole with IOC this AM. Consent obtained yesterday after discussion of the procedure, risks, and benefits.  LOS: 3 days    Danniella Robben E 06/29/2014

## 2014-06-29 NOTE — Anesthesia Postprocedure Evaluation (Signed)
  Anesthesia Post-op Note  Patient: Renee [name]  Procedure(s) Performed: Procedure(s): LAPAROSCOPIC CHOLECYSTECTOMY WITH INTRAOPERATIVE CHOLANGIOGRAM (N/A)  Patient Location: PACU  Anesthesia Type:General  Level of Consciousness: awake and alert   Airway and Oxygen Therapy: Patient Spontanous Breathing  Post-op Pain: none  Post-op Assessment: Post-op Vital signs reviewed, Patient's Cardiovascular Status Stable, Respiratory Function Stable, Patent Airway, No signs of Nausea or vomiting and Pain level controlled  Post-op Vital Signs: Reviewed and stable  Last Vitals:  Filed Vitals:   06/29/14 1035  BP: 122/61  Pulse: 64  Temp:   Resp: 17    Complications: No apparent anesthesia complications

## 2014-06-29 NOTE — Anesthesia Preprocedure Evaluation (Addendum)
Anesthesia Evaluation  Patient identified by MRN, date of birth, ID band Patient awake    Reviewed: Allergy & Precautions, H&P , NPO status , Patient's Chart, lab work & pertinent test results  Airway Mallampati: II TM Distance: >3 FB Neck ROM: Full    Dental no notable dental hx. (+) Teeth Intact, Dental Advisory Given   Pulmonary neg pulmonary ROS,  breath sounds clear to auscultation  Pulmonary exam normal       Cardiovascular negative cardio ROS  Rhythm:Regular Rate:Normal     Neuro/Psych negative neurological ROS  negative psych ROS   GI/Hepatic negative GI ROS, Neg liver ROS,   Endo/Other  negative endocrine ROS  Renal/GU negative Renal ROS  negative genitourinary   Musculoskeletal   Abdominal   Peds  Hematology negative hematology ROS (+)   Anesthesia Other Findings   Reproductive/Obstetrics negative OB ROS                          Anesthesia Physical Anesthesia Plan  ASA: I  Anesthesia Plan: General   Post-op Pain Management:    Induction: Intravenous  Airway Management Planned: Oral ETT  Additional Equipment:   Intra-op Plan:   Post-operative Plan: Extubation in OR  Informed Consent: I have reviewed the patients History and Physical, chart, labs and discussed the procedure including the risks, benefits and alternatives for the proposed anesthesia with the patient or authorized representative who has indicated his/her understanding and acceptance.   Dental advisory given  Plan Discussed with: CRNA  Anesthesia Plan Comments:         Anesthesia Quick Evaluation  

## 2014-06-30 ENCOUNTER — Ambulatory Visit (HOSPITAL_BASED_OUTPATIENT_CLINIC_OR_DEPARTMENT_OTHER)
Admission: RE | Admit: 2014-06-30 | Payer: No Typology Code available for payment source | Source: Ambulatory Visit | Admitting: Surgery

## 2014-06-30 ENCOUNTER — Encounter (HOSPITAL_BASED_OUTPATIENT_CLINIC_OR_DEPARTMENT_OTHER): Admission: RE | Payer: 59 | Source: Ambulatory Visit

## 2014-06-30 SURGERY — LAPAROSCOPIC CHOLECYSTECTOMY
Anesthesia: General

## 2014-06-30 MED ORDER — OXYCODONE HCL 5 MG PO TABS: 5.0000 mg | ORAL_TABLET | ORAL | Status: DC | PRN

## 2014-06-30 NOTE — Progress Notes (Signed)
Discharge instructions and prescription for oxy IR given and explained to pt and husband.  Both verbalized understanding of all orders/instructions.  IV removed by NT.  Pt VSS and ready to go home.  Pt expressed she was very pleased with her care here and that everyone was so nice."  Pt in no s/s of distress.  Pt discharged to home with husband via volunteer transport with all belongings. Vanice Sarahhompson, Aissa Lisowski L

## 2014-06-30 NOTE — Discharge Summary (Signed)
Patient ID: Renee [name] MRN: [id number] DOB/AGE: 1971-08-19 43 y.o.  Admit date: 06/26/2014 Discharge date: 06/30/2014  Procedures: laparoscopic cholecystectomy with IOC by Dr. Violeta GelinasBurke Thompson on 06-29-14  Consults: None  Reason for Admission: The patient is a 43 year old Hispanic female who recently saw Dr. Magnus IvanBlackman for gallstones. She is scheduled for gallbladder surgery this Friday. Her pain worsened about 1:00 this afternoon. She has had significant nausea and vomiting. She returned to the emergency department where she was found to have significantly elevated liver enzymes and lipase  Admission Diagnoses:  1. Gallstone pancreatitis  Hospital Course: The patient was admitted and placed on IVF and NPO to allow for resolution of her pancreatitis.  On HD 4, the patient's lipase was down from >3000 to 100.  Her pain was improved.  She was then taken to the OR where she underwent a lap chole with IOC.  She tolerated this very well.  On POD 1, she was tolerating a regular diet and her pain was well controlled.  She was stable for dc home.  PE: Abd: soft, appropriately tender, +BS, ND, incisions c/d/i  Discharge Diagnoses:  Active Problems:   Gallstone pancreatitis s/p lap chole with IOC  Discharge Medications:   Medication List         multivitamin with minerals Tabs tablet  Take 1 tablet by mouth daily.     oxyCODONE 5 MG immediate release tablet  Commonly known as:  Oxy IR/ROXICODONE  Take 1-2 tablets (5-10 mg total) by mouth every 4 (four) hours as needed (pain).        Discharge Instructions:     Follow-up Information   Follow up with [facility] On 07/18/2014. (3:30pm, arrive by 3:00pm for paperwork )    Contact information:   [address]   [place] [address] [phone]       Signed: Letha CapeOSBORNE,Ellanor Feuerstein E 06/30/2014, 9:00 AM

## 2014-06-30 NOTE — Discharge Instructions (Signed)
CIRUGIA LAPAROSCOPICA: INSTRUCCIONES DE POST OPERATORIO. ° °Revise siempre los documentos que le entreguen en el lugar donde se ha hecho la cirugia. ° °SI USTED NECESITA DOCUMENTOS DE INCAPACIDAD (DISABLE) O DE PERMISO FAMILAR (FAMILY LEAVE) NECESITA TRAERLOS A LA OFICINA PARA QUE SEAN PROCESADOS. °NO  SE LOS DE A SU DOCTOR. °1. A su alta del hospital se le dara una receta para controlar el dolor. Tomela como ha sido recetada, si la necesita. Si no la necesita puede tomar, Acetaminofen (Tylenol) o Ibuprofen (Advil) para aliviar dolor moderado. °2. Continue tomando el resto de sus medicinas. °3. Si necesita rellenar la receta, llame a la farmacia. ellos contactan a nuestra oficina pidiendo autorizacion. Este tipo de receta no pueden ser rellenadas despues de las  5pm o durante los fines de semana. °4. Con relacion a la dieta: debe ser ligera los primeros dias despues que llege a la casa. Ejemplo: sopas y galleticas. Tome bastante liquido esos dias. °5. La mayoria de los pacientes padecen de inflamacion y cambio de coloracion de la piel alrededor de las incisiones. esto toma dias en resolver.  pnerse una bolsa de hielo en el area affectada ayuda..  °6. Es comun tambien tener un poco de estrenimiento si esta tomado medicinas para el dolor. incremente la cantidad de liquidos a tomar y puede tomar (Colace) esto previene el problema. Si ya tiene estrenimiento, es decir no ha defecado en 48 horas, puede tomar un laxativo (Milk of Magnesia or Miralax) uselo como el paquete le explica. °7.  A menos que se le diga algo diferente. Remueva el bendaje a las 24-48 horas despues dela cirugia. y puede banarse en la ducha sin ningun problema. usted puede tener steri-strips (pequenas curitas transparentes en la piel puesta encima de la incision)  Estas banditas strips should be left on the skin for 7-10 days.   Si su cirujano puso pegamento encima de la incision usted puede banarse bajo la ducha en 24 horas. Este pegamento empezara a  caerse en las proximas 2-3 semanas. Si le pusieron suturas o presillas (grapos) estos seran quitados en su proxima cita en la oficina. . °a. ACTIVIDADES:  Puede hacer actividad ligera.  Como caminar , subir escaleras y poco a poco irlas incrementando tanto como las tolere. Puede tener relaciones sexuales cuando sea comfortable. No carge objetos pesados o haga esfuerzos que no sean aprovados por su doctor. °b. Puede manejar en cuanto no esta tomando medicamentos fuertes (narcoticos) para el dolor, pueda abrochar confortablemente el cinturon de seguridad, y pueda maniobrar y usar los pedales de su vehiculo con seguridad. °c. PUEDE REGRESAR A TRABAJAR  °8. Debe ver a su doctor para una cita de seguimiento en 2-3 semanas despues de la cirugia.  °9. OTRAS ISNSTRUCCIONES:___________________________________________________________________________________ °CUANDO LLAMAR A SU MEDICO: °1. FIEBRE mayor de  101.0 °2. No produccion de orina. °3. Sangramiento continue de la herida °4. Incremento de dolor, enrojecimientio o drenaje de la herida (incision) °5. Incremento de dolor abdominal. ° °The clinic staff is available to answer your questions during regular business hours.  Please don’t hesitate to call and ask to speak to one of the nurses for clinical concerns.  If you have a medical emergency, go to the nearest emergency room or call 911.  A surgeon from [facility] is always on call at the hospital. °[address], [address], [place], [place]  [address] ? [address] [address], [place], [place]   [address] °[phone] ? [phone] ? FAX [phone] °Web site: [email or web address] ° ° °

## 2014-06-30 NOTE — Discharge Summary (Signed)
Veroncia Jezek, MD, MPH, FACS Trauma: [phone] General Surgery: [phone]  

## 2014-07-03 ENCOUNTER — Encounter (HOSPITAL_COMMUNITY): Payer: Self-pay | Admitting: General Surgery

## 2015-01-29 ENCOUNTER — Other Ambulatory Visit: Payer: Self-pay | Admitting: Obstetrics & Gynecology

## 2015-01-30 LAB — CYTOLOGY - PAP
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
## 2015-10-10 ENCOUNTER — Ambulatory Visit (INDEPENDENT_AMBULATORY_CARE_PROVIDER_SITE_OTHER): Payer: Commercial Managed Care - PPO | Admitting: Urgent Care

## 2015-10-10 VITALS — BP 120/80 | HR 80 | Temp 98.1°F | Resp 16 | Ht 61.25 in | Wt 156.2 lb

## 2015-10-10 DIAGNOSIS — Z0000 Encounter for general adult medical examination without abnormal findings: Secondary | ICD-10-CM | POA: Diagnosis not present

## 2015-10-10 DIAGNOSIS — Z124 Encounter for screening for malignant neoplasm of cervix: Secondary | ICD-10-CM | POA: Diagnosis not present

## 2015-10-10 DIAGNOSIS — N898 Other specified noninflammatory disorders of vagina: Secondary | ICD-10-CM

## 2015-10-10 DIAGNOSIS — N923 Ovulation bleeding: Secondary | ICD-10-CM

## 2015-10-10 DIAGNOSIS — N939 Abnormal uterine and vaginal bleeding, unspecified: Secondary | ICD-10-CM | POA: Diagnosis not present

## 2015-10-10 DIAGNOSIS — Z23 Encounter for immunization: Secondary | ICD-10-CM

## 2015-10-10 LAB — COMPREHENSIVE METABOLIC PANEL
ALK PHOS: 79 U/L (ref 33–115)
ALT: 17 U/L (ref 6–29)
AST: 15 U/L (ref 10–30)
Albumin: 3.9 g/dL (ref 3.6–5.1)
BUN: 12 mg/dL (ref 7–25)
CO2: 22 mmol/L (ref 20–31)
Calcium: 9 mg/dL (ref 8.6–10.2)
Chloride: 107 mmol/L (ref 98–110)
Creat: 0.65 mg/dL (ref 0.50–1.10)
GLUCOSE: 98 mg/dL (ref 65–99)
Potassium: 4.3 mmol/L (ref 3.5–5.3)
Sodium: 136 mmol/L (ref 135–146)
Total Bilirubin: 0.3 mg/dL (ref 0.2–1.2)
Total Protein: 7.3 g/dL (ref 6.1–8.1)

## 2015-10-10 LAB — LIPID PANEL
Cholesterol: 165 mg/dL (ref 125–200)
HDL: 58 mg/dL (ref >=46)
LDL Cholesterol: 85 mg/dL (ref <130)
Total CHOL/HDL Ratio: 2.8 Ratio (ref <=5.0)
Triglycerides: 110 mg/dL (ref <150)
VLDL: 22 mg/dL (ref <30)

## 2015-10-10 LAB — POCT WET + KOH PREP
Trich by wet prep: ABSENT
YEAST BY KOH: ABSENT
Yeast by wet prep: ABSENT

## 2015-10-10 LAB — CBC
HCT: 34.2 % — ABNORMAL LOW (ref 36.0–46.0)
Hemoglobin: 10.8 g/dL — ABNORMAL LOW (ref 12.0–15.0)
MCH: 22.2 pg — ABNORMAL LOW (ref 26.0–34.0)
MCHC: 31.6 g/dL (ref 30.0–36.0)
MCV: 70.4 fL — AB (ref 78.0–100.0)
MPV: 9.3 fL (ref 8.6–12.4)
PLATELETS: 373 10*3/uL (ref 150–400)
RBC: 4.86 MIL/uL (ref 3.87–5.11)
RDW: 17.1 % — AB (ref 11.5–15.5)
WBC: 6.5 10*3/uL (ref 4.0–10.5)

## 2015-10-10 LAB — TSH: TSH: 1.331 u[IU]/mL (ref 0.350–4.500)

## 2015-10-10 LAB — RPR

## 2015-10-10 LAB — HIV ANTIBODY (ROUTINE TESTING W REFLEX): HIV: NONREACTIVE

## 2015-10-10 NOTE — Progress Notes (Signed)
MRN: [id number]  Subjective:   Ms. Renee [name] is a 45 y.o. female presenting for annual physical exam and leg pain.  Medical care team includes: PCP: No PCP Per Patient Specialists: None. OB/GYN: [facility].  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 Has cycles every month, reports that these have been regular. In the past 3 months, the patient has had spotting for 1-2 days in between her cycles. Admits yellow vaginal discharge for the past 3 months. Denies rashes, pelvic pain, n/v, abdominal pain, dysuria, hematuria. Denies using any contraception. Patient has sex with her husband, uses condoms some of the time, to the best of her knowledge her husband has not cheated on her.   Patient is married, used to work for [employer]. Patient has 3 children, 2 live at home with her. Denies smoking cigarettes or drinking alcohol. Admits that she does not try to eat healthy, exercises some of the time.  Juel has a current medication list which includes the following prescription(s): multivitamin with minerals. She has No Known Allergies.  Winefred  has a past medical history of Gallstone. Also  has past surgical history that includes Cholecystectomy (N/A, 06/29/2014).  Her family history includes Diabetes in her brother, father, and mother.  Immunizations: Flu shot received through her job 07/2015, last TDAP unknown.  Review of Systems  Constitutional: Negative for fever, chills, weight loss, malaise/fatigue and diaphoresis.  HENT: Negative for congestion, ear discharge, ear pain, hearing loss, nosebleeds, sore throat and tinnitus.   Eyes: Negative for blurred vision, double vision, photophobia, pain, discharge and redness.  Respiratory: Negative for cough, shortness of breath and wheezing.   Cardiovascular: Negative for chest pain, palpitations and leg swelling.  Gastrointestinal: Negative for nausea, vomiting, abdominal pain, diarrhea, constipation  and blood in stool.  Genitourinary: Negative for dysuria, urgency, frequency, hematuria and flank pain.  Musculoskeletal: Negative for myalgias, back pain and joint pain.  Skin: Negative for itching and rash.  Neurological: Negative for dizziness, tingling, seizures, loss of consciousness, weakness and headaches.  Endo/Heme/Allergies: Negative for polydipsia.  Psychiatric/Behavioral: Negative for depression, suicidal ideas, hallucinations, memory loss and substance abuse. The patient is not nervous/anxious and does not have insomnia.    Objective:   Vitals: BP 120/80 mmHg  Pulse 80  Temp(Src) 98.1 F (36.7 C) (Oral)  Resp 16  Ht 5' 1.25" (1.556 m)  Wt 156 lb 3.2 oz (70.852 kg)  BMI 29.26 kg/m2  SpO2 98%  LMP 09/22/2015  Physical Exam  Constitutional: She is oriented to person, place, and time. She appears well-developed and well-nourished.  HENT:  TM's intact bilaterally, no effusions or erythema. Nasal turbinates pink and moist, nasal passages patent. No sinus tenderness. Oropharynx clear, mucous membranes moist, dentition in good repair.  Eyes: Conjunctivae and EOM are normal. Pupils are equal, round, and reactive to light. Right eye exhibits no discharge. Left eye exhibits no discharge. No scleral icterus.  Neck: Normal range of motion. Neck supple. No thyromegaly present.  Cardiovascular: Normal rate, regular rhythm and intact distal pulses.  Exam reveals no gallop and no friction rub.   No murmur heard. Pulmonary/Chest: No respiratory distress. She has no wheezes. She has no rales.  Abdominal: Soft. Bowel sounds are normal. She exhibits no distension and no mass. There is no tenderness.  Genitourinary: There is no rash, tenderness or lesion on the right labia. There is no rash, tenderness or lesion on the left labia. Uterus is not  deviated, not enlarged, not fixed and not tender. Cervix exhibits no motion tenderness, no discharge and no friability. Right adnexum displays no  mass, no tenderness and no fullness. Left adnexum displays no mass, no tenderness and no fullness. No erythema, tenderness or bleeding in the vagina. No foreign body around the vagina. Vaginal discharge (slight thick white discharge over cervix) found.  Musculoskeletal: Normal range of motion. She exhibits no edema or tenderness.  Lymphadenopathy:    She has no cervical adenopathy.       Right: No inguinal adenopathy present.       Left: No inguinal adenopathy present.  Neurological: She is alert and oriented to person, place, and time. She has normal reflexes.  Skin: Skin is warm and dry. No rash noted. No erythema. No pallor.  Psychiatric: She has a normal mood and affect.   Results for orders placed or performed in visit on 10/10/15 (from the past 24 hour(s))  POCT Wet + KOH Prep     Status: Abnormal   Collection Time: 10/10/15 10:37 AM  Result Value Ref Range   Yeast by KOH Absent Present, Absent   Yeast by wet prep Absent Present, Absent   WBC by wet prep Few None, Few, Too numerous to count   Clue Cells Wet Prep HPF POC Few (A) None, Too numerous to count   Trich by wet prep Absent Present, Absent   Bacteria Wet Prep HPF POC Few None, Few, Too numerous to count   Epithelial Cells By Principal FinancialWet Pref (UMFC) Few None, Few, Too numerous to count   RBC,UR,HPF,POC Few (A) None RBC/hpf   Assessment and Plan :   1. Annual physical exam - Patient is medically healthy, labs pending - Discussed healthy lifestyle, diet, exercise, preventative care, vaccinations, and addressed patient's concerns.   2. Vaginal discharge 3. Spotting between menses - Unclear etiology. Physical exam findings reassuring, labs pending, follow up by phone.  4. Need for Tdap vaccination - Tdap vaccine greater than or equal to 7yo IM.   Wallis BambergMario Kaesen Rodriguez, PA-C [facility] [phone] 10/10/2015  9:58 AM

## 2015-10-10 NOTE — Patient Instructions (Signed)

## 2015-10-11 LAB — PAP IG, CT-NG, RFX HPV ASCU
Chlamydia Probe Amp: NOT DETECTED
GC Probe Amp: NOT DETECTED

## 2015-10-12 ENCOUNTER — Telehealth: Payer: Self-pay | Admitting: Urgent Care

## 2015-10-12 DIAGNOSIS — D649 Anemia, unspecified: Secondary | ICD-10-CM

## 2015-10-12 MED ORDER — IRON 325 (65 FE) MG PO TABS: 1.0000 | ORAL_TABLET | Freq: Two times a day (BID) | ORAL | Status: DC

## 2015-10-12 NOTE — Telephone Encounter (Signed)
Patient reports pica and cold intolerance. She agreed to start an iron supplement and return to clinic to recheck in a few weeks.

## 2016-10-13 ENCOUNTER — Ambulatory Visit (INDEPENDENT_AMBULATORY_CARE_PROVIDER_SITE_OTHER): Payer: Commercial Managed Care - PPO | Admitting: Physician Assistant

## 2016-10-13 VITALS — BP 128/82 | HR 67 | Temp 98.1°F | Resp 18 | Ht 61.25 in | Wt 163.0 lb

## 2016-10-13 DIAGNOSIS — K219 Gastro-esophageal reflux disease without esophagitis: Secondary | ICD-10-CM

## 2016-10-13 DIAGNOSIS — Z23 Encounter for immunization: Secondary | ICD-10-CM

## 2016-10-13 DIAGNOSIS — R1013 Epigastric pain: Secondary | ICD-10-CM

## 2016-10-13 DIAGNOSIS — Z8719 Personal history of other diseases of the digestive system: Secondary | ICD-10-CM

## 2016-10-13 MED ORDER — OMEPRAZOLE 20 MG PO CPDR: 20.0000 mg | DELAYED_RELEASE_CAPSULE | Freq: Every day | ORAL | 3 refills | Status: DC

## 2016-10-13 MED ORDER — RANITIDINE HCL 150 MG PO TABS: 150.0000 mg | ORAL_TABLET | Freq: Two times a day (BID) | ORAL | 1 refills | Status: DC

## 2016-10-13 NOTE — Progress Notes (Signed)
[facility] [address] [place] [address] [phone]  Date:  10/13/2016   Name:  Renee [name]   DOB:  08/23/1971   MRN:  [id number]  PCP:  No PCP Per Patient    History of Present Illness:  Renee [name] is a 46 y.o. female patient who presents to [facility] for abdominal pain.  She has the sensation that the food is stuck for months.  There is epigastric pain intermittently.  There is some nausea.  There are sometimes sour taste in the mouth.  No coughing.  No emesis.  No fever.  No thyroid issues.    The pain does not radiate anywhere.  No sob or dyspnea.  No palpitations.  She has not taken anything for her symptoms.  She eats some spicy foods, but has reduced this which helped some.  Caffeine--1 cup.  Sodas--twice per week.   Wt Readings from Last 3 Encounters:  10/13/16 163 lb (73.9 kg)  10/10/15 156 lb 3.2 oz (70.9 kg)  06/26/14 149 lb 11.2 oz (67.9 kg)     Chief Complaint  Patient presents with  . Abdominal Pain    x 2 weeks     Patient Active Problem List   Diagnosis Date Noted  . Gallstone pancreatitis 06/26/2014  . Back pain, chronic 02/04/2014  . Gall bladder stones 02/28/2013    Past Medical History:  Diagnosis Date  . Gallstone     Past Surgical History:  Procedure Laterality Date  . CHOLECYSTECTOMY N/A 06/29/2014   Procedure: LAPAROSCOPIC CHOLECYSTECTOMY WITH INTRAOPERATIVE CHOLANGIOGRAM;  Surgeon: Georganna Skeans, MD;  Location: [place];  Service: General;  Laterality: N/A;    Social History  Substance Use Topics  . Smoking status: Never Smoker  . Smokeless tobacco: Never Used  . Alcohol use No    Family History  Problem Relation Age of Onset  . Diabetes Mother   . Diabetes Father   . Diabetes Brother     No Known Allergies  Medication list has been reviewed and updated.  Current Outpatient Prescriptions on File Prior to Visit  Medication Sig Dispense Refill  . Ferrous Sulfate (IRON) 325 (65 Fe) MG TABS Take 1  tablet by mouth 2 (two) times daily. (Patient not taking: Reported on 10/13/2016) 60 each 6  . Multiple Vitamin (MULTIVITAMIN WITH MINERALS) TABS tablet Take 1 tablet by mouth daily.     No current facility-administered medications on file prior to visit.     ROS ROS otherwise unremarkable unless listed above.   Physical Examination: BP 128/82 (BP Location: Right Arm, Patient Position: Sitting, Cuff Size: Small)   Pulse 67   Temp 98.1 F (36.7 C) (Oral)   Resp 18   Ht 5' 1.25" (1.556 m)   Wt 163 lb (73.9 kg)   LMP 10/11/2016 (Exact Date)   SpO2 99%   BMI 30.55 kg/m  Ideal Body Weight: Weight in (lb) to have BMI = 25: 133.1  Physical Exam  Constitutional: She is oriented to person, place, and time. She appears well-developed and well-nourished. No distress.  HENT:  Head: Normocephalic and atraumatic.  Right Ear: External ear normal.  Left Ear: External ear normal.  Eyes: Conjunctivae and EOM are normal. Pupils are equal, round, and reactive to light.  Cardiovascular: Normal rate.   Pulmonary/Chest: Effort normal. No respiratory distress.  Abdominal: Soft. Normal appearance. There is no hepatosplenomegaly. There is tenderness in the epigastric area. There is no tenderness at McBurney's point and  negative Murphy's sign.  Neurological: She is alert and oriented to person, place, and time.  Skin: She is not diaphoretic.  Psychiatric: She has a normal mood and affect. Her behavior is normal.     Assessment and Plan: Renee [name] is a 46 y.o. female who is here today for cc of abdominal pain. This appears to be reflux. I've advised that we will to ranitidine and Prilosec at this time. Advised of GERD friendly diet. I'm also advising that we recheck her lipase as she does have a history of pancreatitis though from gallstone. Gastroesophageal reflux disease, esophagitis presence not specified - Plan: ranitidine (ZANTAC) 150 MG tablet, omeprazole (PRILOSEC) 20 MG  capsule  Abdominal pain, epigastric - Plan: CBC, CMP14+EGFR, Lipase, ranitidine (ZANTAC) 150 MG tablet, omeprazole (PRILOSEC) 20 MG capsule  Hx of pancreatitis - Plan: CBC, CMP14+EGFR, Lipase  Need for prophylactic vaccination and inoculation against influenza - Plan: Flu Vaccine QUAD 36+ mos PF IM (Fluarix & Fluzone Quad PF)  Ivar Drape, PA-C [facility] 1/17/201811:07 AM

## 2016-10-13 NOTE — Patient Instructions (Addendum)
Follow up with me in 2-4 weeks.  Make sure you are not eating within 2 hours of going to sleep.   Opciones de alimentos para pacientes con reflujo gastroesofgico - Adultos (Food Choices for Gastroesophageal Reflux Disease, Adult) Cuando se tiene reflujo gastroesofgico (ERGE), los alimentos que se ingieren y los hbitos de alimentacin son muy importantes. Elegir los alimentos adecuados puede ayudar a Altria Groupaliviar las molestias. QU PAUTAS DEBO SEGUIR?  Elija las frutas, los vegetales, los cereales integrales y los productos lcteos con bajo contenido de Lake Parkgrasa.  Elija las carnes de [place]aca, de pescado y de ave con bajo contenido de grasas.  Limite las grasas, [address]como los Canyon Creekaceites, los aderezos para Homeworthensalada, la La Crossemanteca, los frutos secos y Programme researcher, broadcasting/film/videoel aguacate.  Lleve un registro de alimentos. Esto ayuda a identificar los alimentos que ocasionan sntomas.  Evite los alimentos que le ocasionen sntomas. Pueden ser distintos para cada persona.  Haga comidas pequeas durante Glass blower/designerel da en lugar de 3 comidas abundantes.  Coma lentamente, en un lugar donde est distendido.  Limite el consumo de alimentos fritos.  Cocine los alimentos utilizando mtodos que no sean la fritura.  Evite el consumo alcohol.  Evite beber grandes cantidades de lquidos con las comidas.  Evite agacharse o recostarse hasta despus de 2 o 3horas de haber comido. QU ALIMENTOS NO SE RECOMIENDAN? Estos son algunos alimentos y bebidas que pueden empeorar los sntomas: Garment/textile technologistVegetales  Tomates. Jugo de tomate. Salsa de tomate y espagueti. Ajes. Cebolla y Friendshipajo. Rbano picante. Frutas  Naranjas, pomelos y limn (fruta y Sloveniajugo). Carnes  Carnes de [place] Canyonvaca, de pescado y de ave con gran contenido de grasas. Esto incluye los perros calientes, las North Clarendoncostillas, el Twentynine Palmsjamn, la salchicha, el salame y el tocino. Lcteos  Leche entera y Wilseyleche chocolatada. PPG IndustriesCrema cida. Crema. Mantequilla. Helados. Queso crema. Bebidas  T o caf. Bebidas gaseosas o bebidas  energizantes. Condimentos  Salsa picante. Salsa barbacoa. Dulces/postres  Chocolate y cacao. Rosquillas. Menta y mentol. Grasas y Clear Channel Communicationsaceites  Alimentos muy grasos. Esto incluye las papas fritas. Otros  Vinagre. Especias picantes. Esto incluye la pimienta negra, la pimienta blanca, la pimienta roja, la pimienta de cayena, el curry en polvo, los clavos de Pecktonvilleolor, el jengibre y el [place] en polvo. Esta no es Raytheonuna lista completa de los alimentos y las bebidas que se Theatre stage managerdeben evitar. Comunquese con el nutricionista para recibir ms informacin.  Esta informacin no tiene Theme park managercomo fin reemplazar el consejo del mdico. Asegrese de hacerle al mdico cualquier pregunta que tenga. Document Released: 03/16/2012 Document Revised: 10/06/2014 Document Reviewed: 07/20/2013 Elsevier Interactive Patient Education  2017 Elsevier Inc.   Dieta con alto contenido de hierro (Iron-Rich Diet) El hierro es un mineral que ayuda al organismo a producir hemoglobina. La hemoglobina es una protena de los glbulos rojos que transporta el oxgeno a los tejidos del cuerpo. Consumir muy poco hierro Stryker Corporationpuede hacer que se sienta dbil y Trimblecansado, y aumentar su riesgo de contraer infecciones. Consumir la cantidad suficiente de hierro es necesario para el metabolismo del cuerpo, el funcionamiento muscular y el Oakwood Parksistema nervioso. El hierro es un componente natural de muchos alimentos. Tambin puede agregarse a los alimentos, o bien los alimentos pueden fortificarse con hierro. Hay dos tipos de hierro de los alimentos:  Hierro hemo. El organismo absorbe el hierro hemo con mayor facilidad que el no hemo. La carne de res, de ave y de pescado contienen hierro hemo.  Hierro no hemo. Se encuentra en los complementos alimenticios, los cereales fortificados con hierro, los  frijoles y las verduras. Es posible que deba seguir una dieta con alto contenido de hierro en los siguientes casos:  Si le han diagnosticado una deficiencia de hierro o anemia por  deficiencia de hierro.  Si tiene una enfermedad que le impide absorber el hierro de los alimentos, por ejemplo:  Infecciones intestinales.  Enfermedad celaca. Esto incluye la inflamacin permanente (crnica) de los intestinos.  No consume suficiente hierro.  Consume una dieta que incluye muchos alimentos que afectan la absorcin de hierro.  Ha perdido The Progressive Corporation.  Tiene sangrado abundante cuando [place].  Est embarazada. EN QU CONSISTE EL PLAN? El mdico puede ayudarlo a Production assistant, radio cantidad de hierro que necesita a diario en funcin de su cuadro clnico. Por lo general, Adelene Amas persona consume cantidades suficientes de hierro en la dieta, se satisfacen las siguientes necesidades de hierro:  Hombres.  De 14 a 18aos: 11mg  al da.  De 19 a 50aos: 8mg  al Futures trader.  Mujeres.  De 14 a 18aos: 15mg  al Futures trader.  De 19 a 50aos: 18mg  al da.  Mayores de 50aos: 8mg  al da.  Embarazadas: 27mg  al da.  Mujeres en perodo de lactancia: 9mg  al da. QU DEBO SABER ACERCA DE LA DIETA CON ALTO CONTENIDO DE HIERRO?  Consuma frutas y verduras frescas con alto contenido de vitaminaC junto con alimentos ricos en hierro. Esto ayudar a aumentar la cantidad de hierro que el cuerpo absorbe de los alimentos, en especial, con los que contienen hierro no hemo. Entre los alimentos con alto contenido de vitaminaC se incluyen las [place], los pimientos morrones, los tomates y Social worker.  Tome los suplementos de hierro solamente como se lo haya indicado el mdico. La sobredosis de hierro puede ser potencialmente mortal. Si le recetan suplementos de hierro, tmelos con jugo de naranja o un suplemento de vitaminaC.  Cocine los alimentos en ollas de hierro.  Consuma alimentos que contengan hierro no hemo junto con aquellos con alto contenido de hierro hemo. Esto ayuda a mejorar la absorcin de hierro.  Determinados alimentos y ciertas bebidas contienen compuestos que afectan la  absorcin de hierro. No consuma estos alimentos en la misma comida que aquellos con alto contenido de hierro o con suplementos de Company secretary. Estos incluyen los siguientes:  Caf, t negro y vino tinto.  Leche, productos lcteos y alimentos con alto contenido de calcio.  Frijoles, porotos de soja y [place].  Cereales integrales.  Cuando consuma alimentos que contengan hierro no hemo y compuestos que afecten la absorcin de hierro, siga estos consejos para mejorar la absorcin de hierro.  Antes de cocinarlos, remoje los frijoles durante la noche.  Antes de usarlos, remoje los cereales integrales durante la noche y culelos.  Prepare un fermento con las harinas antes del horneado, como si usara levadura en la masa del pan. QU ALIMENTOS PUEDO COMER? Cereales  Cereales para el desayuno fortificados con hierro. Pan de trigo integral fortificado con hierro. Arroz enriquecido. Granos germinados. Verduras  Espinaca. Papas con cscara. Guisantes. Brcoli. Pimientos morrones rojos y verdes. Verduras fermentadas. Frutas  Ciruelas pasas. Pasas. Naranjas. Jinny Sanders. Mango. Pomelo. Carnes y otras fuentes de protenas  Hgado de res. Ostras. Carne de vaca. Camarones. Pavo. Pollo. Atn. Sardinas. Garbanzos. Nueces. Tofu. Bebidas  Jugo de tomate. Jugo de naranja recin exprimido. Jugo de ciruelas. T de hibisco. Batidos instantneos fortificados para el desayuno. Condimentos  Tahini. Salsa de soja fermentada. Dulces y Associate Professor. Otros  Germen de trigo. Los artculos mencionados arriba pueden no ser Air Products and Chemicals  lista completa de las bebidas o los alimentos recomendados. Comunquese con el nutricionista para conocer ms opciones.  QU ALIMENTOS NO SE RECOMIENDAN? Cereales  Cereales integrales. Cereal de salvado. Harina de salvado. Avena. Verduras  Alcachofas. Repollitos de Bruselas. Col rizada. Nils Pyle  Arndanos. Blanchie Serve. Jinny Sanders. Higos. Carnes y otras fuentes de protenas  Soja.  Productos elaborados a base de protena de la soja. Lcteos  Leche. Crema. Queso. Yogur. CSX Corporation. Bebidas  Caf. T negro. Vino tinto. Dulces y Hershey Company. Chocolate. Helados. Otros  Albahaca. Organo. Perejil. Los artculos mencionados arriba pueden no ser Raytheon de las bebidas y los alimentos que se Theatre stage manager. Comunquese con el nutricionista para obtener ms informacin.  Esta informacin no tiene Theme park manager el consejo del mdico. Asegrese de hacerle al mdico cualquier pregunta que tenga. Document Released: 07/02/2006 Document Revised: 10/06/2014 Document Reviewed: 04/12/2014 Elsevier Interactive Patient Education  2017 ArvinMeritor.   IF you received an x-ray today, you will receive an invoice from [facility]. Please contact [facility] at [phone] with questions or concerns regarding your invoice.   IF you received labwork today, you will receive an invoice from [facility]. Please contact [facility] at [phone] with questions or concerns regarding your invoice.   Our billing staff will not be able to assist you with questions regarding bills from these companies.  You will be contacted with the lab results as soon as they are available. The fastest way to get your results is to activate your My Chart account. Instructions are located on the last page of this paperwork. If you have not heard from [place] regarding the results in 2 weeks, please contact this office.

## 2016-10-14 LAB — CMP14+EGFR
A/G RATIO: 1.3 (ref 1.2–2.2)
ALT: 22 IU/L (ref 0–32)
AST: 15 IU/L (ref 0–40)
Albumin: 4 g/dL (ref 3.5–5.5)
Alkaline Phosphatase: 92 IU/L (ref 39–117)
BUN/Creatinine Ratio: 14 (ref 9–23)
BUN: 8 mg/dL (ref 6–24)
Bilirubin Total: <0.2 mg/dL (ref 0.0–1.2)
CALCIUM: 9 mg/dL (ref 8.7–10.2)
CO2: 25 mmol/L (ref 18–29)
Chloride: 102 mmol/L (ref 96–106)
Creatinine, Ser: 0.59 mg/dL (ref 0.57–1.00)
GFR, EST AFRICAN AMERICAN: 129 mL/min/{1.73_m2} (ref >59)
GFR, EST NON AFRICAN AMERICAN: 112 mL/min/{1.73_m2} (ref >59)
Globulin, Total: 3.2 g/dL (ref 1.5–4.5)
Glucose: 81 mg/dL (ref 65–99)
Potassium: 4.6 mmol/L (ref 3.5–5.2)
Sodium: 138 mmol/L (ref 134–144)
TOTAL PROTEIN: 7.2 g/dL (ref 6.0–8.5)

## 2016-10-14 LAB — CBC
HEMOGLOBIN: 12.3 g/dL (ref 11.1–15.9)
Hematocrit: 37.5 % (ref 34.0–46.6)
MCH: 26.3 pg — ABNORMAL LOW (ref 26.6–33.0)
MCHC: 32.8 g/dL (ref 31.5–35.7)
MCV: 80 fL (ref 79–97)
PLATELETS: 376 10*3/uL (ref 150–379)
RBC: 4.68 x10E6/uL (ref 3.77–5.28)
RDW: 14.6 % (ref 12.3–15.4)
WBC: 6.7 10*3/uL (ref 3.4–10.8)

## 2016-10-14 LAB — LIPASE: Lipase: 49 U/L (ref 14–72)

## 2016-11-17 ENCOUNTER — Ambulatory Visit (INDEPENDENT_AMBULATORY_CARE_PROVIDER_SITE_OTHER): Payer: Commercial Managed Care - PPO | Admitting: Physician Assistant

## 2016-11-17 VITALS — BP 122/74 | HR 74 | Temp 97.8°F | Resp 16 | Ht 61.25 in | Wt 165.4 lb

## 2016-11-17 DIAGNOSIS — R202 Paresthesia of skin: Secondary | ICD-10-CM

## 2016-11-17 DIAGNOSIS — R200 Anesthesia of skin: Secondary | ICD-10-CM

## 2016-11-17 NOTE — Progress Notes (Signed)
[facility] [address] [place] [address] [phone]  Date:  11/17/2016   Name:  Renee [name]   DOB:  09/05/1971   MRN:  [id number]  PCP:  No PCP Per Patient    History of Present Illness:  Renee [name] is a 46 y.o. female patient who presents to [facility] for follow up of reflux.   Symptoms have improved from her report and has resolved.  She however reports numbness and tingling in her extremities bilaterally.  Generally prominent at night.  There is no tremulousness.  No dizziness or weakness.  It keeps her up at night.  No black or blood in stool No heartburn, chest pain, vision changes No changes in speech No new medications on board.  She does have to do her repetitious movement with her work. She has no hx of sleeping on her hands. Wt Readings from Last 3 Encounters:  11/17/16 165 lb 6.4 oz (75 kg)  10/13/16 163 lb (73.9 kg)  10/10/15 156 lb 3.2 oz (70.9 kg)     Patient Active Problem List   Diagnosis Date Noted  . Gallstone pancreatitis 06/26/2014  . Back pain, chronic 02/04/2014  . Gall bladder stones 02/28/2013    Past Medical History:  Diagnosis Date  . Gallstone     Past Surgical History:  Procedure Laterality Date  . CHOLECYSTECTOMY N/A 06/29/2014   Procedure: LAPAROSCOPIC CHOLECYSTECTOMY WITH INTRAOPERATIVE CHOLANGIOGRAM;  Surgeon: Violeta Gelinas, MD;  Location: [facility];  Service: General;  Laterality: N/A;    Social History  Substance Use Topics  . Smoking status: Never Smoker  . Smokeless tobacco: Never Used  . Alcohol use No    Family History  Problem Relation Age of Onset  . Diabetes Mother   . Diabetes Father   . Diabetes Brother     No Known Allergies  Medication list has been reviewed and updated.  Current Outpatient Prescriptions on File Prior to Visit  Medication Sig Dispense Refill  . Ferrous Sulfate (IRON) 325 (65 Fe) MG TABS Take 1 tablet by mouth 2 (two) times daily. (Patient not taking: Reported  on 10/13/2016) 60 each 6  . Multiple Vitamin (MULTIVITAMIN WITH MINERALS) TABS tablet Take 1 tablet by mouth daily.    Marland Kitchen omeprazole (PRILOSEC) 20 MG capsule Take 1 capsule (20 mg total) by mouth daily. 30 capsule 3  . ranitidine (ZANTAC) 150 MG tablet Take 1 tablet (150 mg total) by mouth 2 (two) times daily. 60 tablet 1   No current facility-administered medications on file prior to visit.     ROS   Physical Examination: BP 122/74 (BP Location: Right Arm, Patient Position: Sitting, Cuff Size: Small)   Pulse 74   Temp 97.8 F (36.6 C) (Oral)   Resp 16   Ht 5' 1.25" (1.556 m)   Wt 165 lb 6.4 oz (75 kg)   LMP 11/03/2016   SpO2 100%   BMI 31.00 kg/m  Ideal Body Weight: Weight in (lb) to have BMI = 25: 133.1  Physical Exam  Constitutional: She is oriented to person, place, and time. She appears well-developed and well-nourished. No distress.  HENT:  Head: Normocephalic and atraumatic.  Right Ear: External ear normal.  Left Ear: External ear normal.  Eyes: Conjunctivae and EOM are normal. Pupils are equal, round, and reactive to light.  Cardiovascular: Normal rate, regular rhythm and normal heart sounds.   Pulmonary/Chest: Effort normal. No respiratory distress. She has no decreased breath sounds. She  has no wheezes.  Musculoskeletal:  Normal stength in UEs.  No erythema to joints or swelling.  Negative tinel's and phalen.  Neurological: She is alert and oriented to person, place, and time. No cranial nerve deficit.  Skin: She is not diaphoretic.  Psychiatric: She has a normal mood and affect. Her behavior is normal.     Assessment and Plan: Renee [name] is a 46 y.o. female who is here today for UE tingling. Possibly tendinitis.   Advised that she can use braces, and reassured that this was not likely a SE of the medication. Will check a b12.  Reflux has resolved and she will follow gerd diet at this time.  Numbness and tingling in both hands - Plan: CBC, Vitamin  B12  Trena PlattStephanie Refael Fulop, PA-C [facility] 2/28/201810:32 PM

## 2016-11-18 ENCOUNTER — Telehealth: Payer: Self-pay | Admitting: Physician Assistant

## 2016-11-18 LAB — VITAMIN B12: VITAMIN B 12: 470 pg/mL (ref 232–1245)

## 2016-11-18 LAB — CBC
HEMATOCRIT: 36.4 % (ref 34.0–46.6)
Hemoglobin: 11.9 g/dL (ref 11.1–15.9)
MCH: 26.2 pg — ABNORMAL LOW (ref 26.6–33.0)
MCHC: 32.7 g/dL (ref 31.5–35.7)
MCV: 80 fL (ref 79–97)
PLATELETS: 369 10*3/uL (ref 150–379)
RBC: 4.54 x10E6/uL (ref 3.77–5.28)
RDW: 15.7 % — AB (ref 12.3–15.4)
WBC: 8.4 10*3/uL (ref 3.4–10.8)

## 2016-11-18 MED ORDER — MELOXICAM 7.5 MG PO TABS: 7.5000 mg | ORAL_TABLET | Freq: Every day | ORAL | 0 refills | Status: DC

## 2016-11-18 NOTE — Telephone Encounter (Signed)
mobic prescribed.

## 2016-11-19 ENCOUNTER — Telehealth: Payer: Self-pay | Admitting: Emergency Medicine

## 2016-11-19 NOTE — Telephone Encounter (Signed)
-----   Message from Garnetta BuddyStephanie D English, [place] sent at 11/18/2016  7:50 AM EST ----- b12 and cbc are normal.  No anemia.  No need to restart iron tablets.  Try the wrist splints and take mobic, and anti-inflammatory for 1 week.  Stay away from ibuprofen and naproxen.  This was sent to the pharmacy.  Please take with the omeprazole.  Let us know if the symptoms do not resolve.  Keep to the reflux diet we discussed.

## 2017-04-16 ENCOUNTER — Other Ambulatory Visit: Payer: Self-pay | Admitting: Obstetrics & Gynecology

## 2017-04-16 DIAGNOSIS — R928 Other abnormal and inconclusive findings on diagnostic imaging of breast: Secondary | ICD-10-CM

## 2017-04-30 ENCOUNTER — Ambulatory Visit
Admission: RE | Admit: 2017-04-30 | Discharge: 2017-04-30 | Disposition: A | Discharge status: Home / Self Care | Payer: Commercial Managed Care - PPO | Source: Ambulatory Visit | Attending: Obstetrics & Gynecology | Admitting: Obstetrics & Gynecology

## 2017-04-30 ENCOUNTER — Ambulatory Visit: Payer: Self-pay

## 2017-04-30 DIAGNOSIS — R928 Other abnormal and inconclusive findings on diagnostic imaging of breast: Secondary | ICD-10-CM

## 2020-01-09 ENCOUNTER — Ambulatory Visit: Payer: Commercial Managed Care - PPO

## 2020-02-13 ENCOUNTER — Telehealth: Payer: Self-pay | Admitting: Adult Health

## 2020-02-13 NOTE — Telephone Encounter (Signed)
Received a new pt referral from Dr. Langston Masker at [facility] for IDA. Renee [name] has been cld and scheduled to see [name] on 5/19 at 1:30pm w/labs at 1pm. Pt aware to arrive 15 minutes early.

## 2020-02-15 ENCOUNTER — Encounter: Payer: Self-pay | Admitting: Adult Health

## 2020-02-15 ENCOUNTER — Other Ambulatory Visit: Payer: Self-pay

## 2020-02-15 ENCOUNTER — Inpatient Hospital Stay: Payer: Commercial Managed Care - PPO | Admitting: Adult Health

## 2020-02-15 ENCOUNTER — Inpatient Hospital Stay: Payer: Commercial Managed Care - PPO | Attending: Adult Health

## 2020-02-15 VITALS — BP 120/64 | HR 66 | Temp 98.9°F | Resp 17 | Ht 62.0 in | Wt 165.2 lb

## 2020-02-15 DIAGNOSIS — D509 Iron deficiency anemia, unspecified: Secondary | ICD-10-CM | POA: Insufficient documentation

## 2020-02-15 DIAGNOSIS — D500 Iron deficiency anemia secondary to blood loss (chronic): Secondary | ICD-10-CM

## 2020-02-15 DIAGNOSIS — Z79899 Other long term (current) drug therapy: Secondary | ICD-10-CM | POA: Insufficient documentation

## 2020-02-15 DIAGNOSIS — N920 Excessive and frequent menstruation with regular cycle: Secondary | ICD-10-CM | POA: Diagnosis not present

## 2020-02-15 DIAGNOSIS — D649 Anemia, unspecified: Secondary | ICD-10-CM

## 2020-02-15 LAB — CBC WITH DIFFERENTIAL (CANCER CENTER ONLY)
Abs Immature Granulocytes: 0.01 10*3/uL (ref 0.00–0.07)
Basophils Absolute: 0.1 10*3/uL (ref 0.0–0.1)
Basophils Relative: 1 %
Eosinophils Absolute: 0.2 10*3/uL (ref 0.0–0.5)
Eosinophils Relative: 4 %
HCT: 31.2 % — ABNORMAL LOW (ref 36.0–46.0)
Hemoglobin: 9 g/dL — ABNORMAL LOW (ref 12.0–15.0)
Immature Granulocytes: 0 %
Lymphocytes Relative: 41 %
Lymphs Abs: 2.2 10*3/uL (ref 0.7–4.0)
MCH: 19.7 pg — ABNORMAL LOW (ref 26.0–34.0)
MCHC: 28.8 g/dL — ABNORMAL LOW (ref 30.0–36.0)
MCV: 68.4 fL — ABNORMAL LOW (ref 80.0–100.0)
Monocytes Absolute: 0.4 10*3/uL (ref 0.1–1.0)
Monocytes Relative: 8 %
Neutro Abs: 2.5 10*3/uL (ref 1.7–7.7)
Neutrophils Relative %: 46 %
Platelet Count: 361 10*3/uL (ref 150–400)
RBC: 4.56 MIL/uL (ref 3.87–5.11)
RDW: 21.4 % — ABNORMAL HIGH (ref 11.5–15.5)
WBC Count: 5.4 10*3/uL (ref 4.0–10.5)
nRBC: 0 % (ref 0.0–0.2)

## 2020-02-15 LAB — CMP (CANCER CENTER ONLY)
ALT: 19 U/L (ref 0–44)
AST: 32 U/L (ref 15–41)
Albumin: 3.7 g/dL (ref 3.5–5.0)
Alkaline Phosphatase: 93 U/L (ref 38–126)
Anion gap: 8 (ref 5–15)
BUN: 11 mg/dL (ref 6–20)
CO2: 22 mmol/L (ref 22–32)
Calcium: 9 mg/dL (ref 8.9–10.3)
Chloride: 106 mmol/L (ref 98–111)
Creatinine: 0.73 mg/dL (ref 0.44–1.00)
GFR, Est AFR Am: >60 mL/min (ref >60)
GFR, Estimated: >60 mL/min (ref >60)
Glucose, Bld: 105 mg/dL — ABNORMAL HIGH (ref 70–99)
Potassium: 4.3 mmol/L (ref 3.5–5.1)
Sodium: 136 mmol/L (ref 135–145)
Total Bilirubin: 0.2 mg/dL — ABNORMAL LOW (ref 0.3–1.2)
Total Protein: 7.7 g/dL (ref 6.5–8.1)

## 2020-02-15 LAB — C-REACTIVE PROTEIN: CRP: 0.7 mg/dL (ref <1.0)

## 2020-02-15 LAB — IRON AND TIBC
Iron: 182 ug/dL — ABNORMAL HIGH (ref 41–142)
Saturation Ratios: 37 % (ref 21–57)
TIBC: 485 ug/dL — ABNORMAL HIGH (ref 236–444)
UIBC: 303 ug/dL (ref 120–384)

## 2020-02-15 LAB — SEDIMENTATION RATE: Sed Rate: 18 mm/hr (ref 0–22)

## 2020-02-15 LAB — RETIC PANEL
Immature Retic Fract: 18.9 % — ABNORMAL HIGH (ref 2.3–15.9)
RBC.: 4.56 MIL/uL (ref 3.87–5.11)
Retic Count, Absolute: 68.9 10*3/uL (ref 19.0–186.0)
Retic Ct Pct: 1.5 % (ref 0.4–3.1)
Reticulocyte Hemoglobin: 24.8 pg — ABNORMAL LOW (ref >27.9)

## 2020-02-15 LAB — FOLATE: Folate: 18.9 ng/mL (ref >5.9)

## 2020-02-15 LAB — SAVE SMEAR(SSMR), FOR PROVIDER SLIDE REVIEW

## 2020-02-15 LAB — VITAMIN B12: Vitamin B-12: 240 pg/mL (ref 180–914)

## 2020-02-15 LAB — FERRITIN: Ferritin: 5 ng/mL — ABNORMAL LOW (ref 11–307)

## 2020-02-15 NOTE — Progress Notes (Addendum)
[facility]  [phone] [phone]     ID: Renee [name] DOB: 10-23-1970  MR#: [id number]  YQM#:[id number]  Patient Care Team: Patient, No Pcp Per as PCP - General (General Practice) Chauncey Cruel, MD OTHER MD:  CHIEF COMPLAINT: iron deficiency anemia  CURRENT TREATMENT: to receive IV iron    HISTORY OF CURRENT ILLNESS:  Renee [name] is here at the request of Dr. Lynnette Caffey for evaluation of her iron deficiency.  [name] notes that she has a history of heavy menstrual cycles.  She notes that her periods were very heavy with clotting.  She followed up with her gyn, who noted her iron deficiency, and she was recommended to take an OTC iron supplement, and was prescribed tranexamic acid.  She notes that her most recent cycle started 3 days ago and was much improved.    [name] at her PCP was 8.8 and her MCV was 68.  Her iron was 15.    Her hemoglobin as noted in Epic records is as follows:  Results for ALYSSIA, HEESE (MRN [id number]) as of 02/15/2020 14:40  Ref. Range 06/28/2014 04:13 10/10/2015 09:42 10/13/2016 15:06 11/17/2016 17:00 02/15/2020 13:00  Hemoglobin Latest Ref Range: 12.0 - 15.0 g/dL 10.5 (L) 10.8 (L) 12.3 11.9 9.0 (L)   Results for ARISA, CONGLETON (MRN [id number]) as of 02/15/2020 14:40  Ref. Range 06/28/2014 04:13 10/10/2015 09:42 10/13/2016 15:06 11/17/2016 17:00 02/15/2020 13:00  MCV Latest Ref Range: 80.0 - 100.0 fL 79.6 70.4 (L) 80 80 68.4 (L)    The patient's subsequent history is as detailed below.  INTERVAL HISTORY:  [name] notes that she is feeling well.  She is taking iron daily with pretty good tolerance.  The only issue she has noted recently is the fact that she has intermittent epigastric abdominal pain with no clear pattern or cause.  She says it is fleeting and dull.  She denies any fevers, blood in her stool, black tarry stool, or any other concerns.     REVIEW OF SYSTEMS: Renee [name] is working at a [employer] (physical [employer]).  She denies any fever, chills, night sweats, lymphadenopathy, cough, shortness of breath, unintentional weight loss, nausea, vomiting, dysphagia, reflux, bowel/bladder changes.  A detailed ROS was otherwise non contributory.    PAST MEDICAL HISTORY: Past Medical History:  Diagnosis Date  . Gallstone     PAST SURGICAL HISTORY: Past Surgical History:  Procedure Laterality Date  . CHOLECYSTECTOMY N/A 06/29/2014   Procedure: LAPAROSCOPIC CHOLECYSTECTOMY WITH INTRAOPERATIVE CHOLANGIOGRAM;  Surgeon: Georganna Skeans, MD;  Location: [facility] OR;  Service: General;  Laterality: N/A;    FAMILY HISTORY Family History  Problem Relation Age of Onset  . Diabetes Mother   . Diabetes Father   . Diabetes Brother     GYNECOLOGIC HISTORY:  No LMP recorded. Menarche: 49 years old Age at first live birth: 49 years old Mer[place] P 4 LMP 02/12/2020 Contraceptive no HRT no  Hysterectomy? no Salpingo-oophorectomy?no    SOCIAL HISTORY: Married and lives with her husband armando in [place].  She works at a [employer] and her husband works in Architect.  Her two younger children, [name] 79, and [name] 25 live with her along with her graddaughter.  Her older daughter [name] is 82 and lives in [place] with her three grandchildren.  She is originally from [place] and move to [place] when she was 49 years old.  She lived in [place] initially for a few years and then  [place]       ADVANCED DIRECTIVES: not in place   HEALTH MAINTENANCE: Social History   Tobacco Use  . Smoking status: Never Smoker  . Smokeless tobacco: Never Used  Substance Use Topics  . Alcohol use: No  . Drug use: No     Colonoscopy: never undergone  PAP: up to date  Bone density:  Mammogram: up to date   No Known Allergies  Current Outpatient Medications  Medication Sig Dispense Refill  . Ferrous Gluconate (IRON 27 PO) Take by mouth daily.    . tranexamic acid (LYSTEDA) 650 MG TABS  tablet Take 1,300 mg by mouth 3 (three) times daily.     No current facility-administered medications for this visit.    OBJECTIVE:  Vitals:   02/15/20 1332  BP: 120/64  Pulse: 66  Resp: 17  Temp: 98.9 F (37.2 C)  SpO2: 100%     Body mass index is 30.22 kg/m.   Wt Readings from Last 3 Encounters:  02/15/20 165 lb 3.2 oz (74.9 kg)  11/17/16 165 lb 6.4 oz (75 kg)  10/13/16 163 lb (73.9 kg)  ECOG FS:1 - Symptomatic but completely ambulatory GENERAL: Patient is a well appearing female in no acute distress HEENT:  Sclerae anicteric.  Mask in place.  Neck is supple.  NODES:  No cervical, supraclavicular, or axillary lymphadenopathy palpated.  BREAST EXAM:  Deferred. LUNGS:  Clear to auscultation bilaterally.  No wheezes or rhonchi. HEART:  Regular rate and rhythm. No murmur appreciated. ABDOMEN:  Soft, nontender.  Positive, normoactive bowel sounds. No organomegaly palpated. MSK:  No focal spinal tenderness to palpation. Full range of motion bilaterally in the upper extremities. EXTREMITIES:  No peripheral edema.   SKIN:  Clear with no obvious rashes or skin changes. No nail dyscrasia. NEURO:  Nonfocal. Well oriented.  Appropriate affect.    LAB RESULTS:  CMP     Component Value Date/Time   NA 136 02/15/2020 1300   NA 138 10/13/2016 1506   K 4.3 02/15/2020 1300   CL 106 02/15/2020 1300   CO2 22 02/15/2020 1300   GLUCOSE 105 (H) 02/15/2020 1300   BUN 11 02/15/2020 1300   BUN 8 10/13/2016 1506   CREATININE 0.73 02/15/2020 1300   CREATININE 0.65 10/10/2015 0942   CALCIUM 9.0 02/15/2020 1300   PROT 7.7 02/15/2020 1300   PROT 7.2 10/13/2016 1506   ALBUMIN 3.7 02/15/2020 1300   ALBUMIN 4.0 10/13/2016 1506   AST 32 02/15/2020 1300   ALT 19 02/15/2020 1300   ALKPHOS 93 02/15/2020 1300   BILITOT 0.2 (L) 02/15/2020 1300   GFRNONAA >60 02/15/2020 1300   GFRAA >60 02/15/2020 1300    No results found for: TOTALPROTELP, ALBUMINELP, A1GS, A2GS, BETS, BETA2SER, GAMS,  MSPIKE, SPEI  No results found for: Nils Pyle, KAPLAMBRATIO  Lab Results  Component Value Date   WBC 5.4 02/15/2020   NEUTROABS 2.5 02/15/2020   HGB 9.0 (L) 02/15/2020   HCT 31.2 (L) 02/15/2020   MCV 68.4 (L) 02/15/2020   PLT 361 02/15/2020      Chemistry      Component Value Date/Time   NA 136 02/15/2020 1300   NA 138 10/13/2016 1506   K 4.3 02/15/2020 1300   CL 106 02/15/2020 1300   CO2 22 02/15/2020 1300   BUN 11 02/15/2020 1300   BUN 8 10/13/2016 1506   CREATININE 0.73 02/15/2020 1300   CREATININE 0.65 10/10/2015 0942      Component Value  Date/Time   CALCIUM 9.0 02/15/2020 1300   ALKPHOS 93 02/15/2020 1300   AST 32 02/15/2020 1300   ALT 19 02/15/2020 1300   BILITOT 0.2 (L) 02/15/2020 1300       No results found for: LABCA2  No components found for: [id number]  No results for input(s): INR in the last 168 hours.  No results found for: LABCA2  No results found for: XBJ478  No results found for: GNF621  No results found for: HYQ657  No results found for: CA2729  No components found for: HGQUANT  No results found for: CEA1 / No results found for: CEA1   No results found for: AFPTUMOR  No results found for: CHROMOGRNA  No results found for: PSA1  Appointment on 02/15/2020  Component Date Value Ref Range Status  . CRP 02/15/2020 0.7  <1.0 mg/dL Final   Performed at [facility] [address] [place], [place] [address]  . Sed Rate 02/15/2020 18  0 - 22 mm/hr Final   Performed at [facility], [place] [address], [place], [place] [address]  . Folate 02/15/2020 18.9  >5.9 ng/mL Final   Performed at [facility] [address], [place], [place] [address]  . Vitamin B-12 02/15/2020 240  180 - 914 pg/mL Final   Comment: (NOTE) This assay is not validated for testing neonatal or myeloproliferative syndrome specimens for Vitamin B12 levels. Performed at [facility], [place] [address], [place], [place] [address]   . Ferritin 02/15/2020 5* 11 - 307 ng/mL Final   Performed at [facility], [place] [address] [place], [place] [address]  . Iron 02/15/2020 182* 41 - 142 ug/dL Final  . TIBC 02/15/2020 485* 236 - 444 ug/dL Final  . Saturation Ratios 02/15/2020 37  21 - 57 % Final  . UIBC 02/15/2020 303  120 - 384 ug/dL Final   Performed at [facility], [place] [address] [place], [place] [address]  . Sodium 02/15/2020 136  135 - 145 mmol/L Final  . Potassium 02/15/2020 4.3  3.5 - 5.1 mmol/L Final  . Chloride 02/15/2020 106  98 - 111 mmol/L Final  . CO2 02/15/2020 22  22 - 32 mmol/L Final  . Glucose, Bld 02/15/2020 105* 70 - 99 mg/dL Final   Glucose reference range applies only to samples taken after fasting for at least 8 hours.  . BUN 02/15/2020 11  6 - 20 mg/dL Final  . Creatinine 02/15/2020 0.73  0.44 - 1.00 mg/dL Final  . Calcium 02/15/2020 9.0  8.9 - 10.3 mg/dL Final  . Total Protein 02/15/2020 7.7  6.5 - 8.1 g/dL Final  . Albumin 02/15/2020 3.7  3.5 - 5.0 g/dL Final  . AST 02/15/2020 32  15 - 41 U/L Final  . ALT 02/15/2020 19  0 - 44 U/L Final  . Alkaline Phosphatase 02/15/2020 93  38 - 126 U/L Final  . Total Bilirubin 02/15/2020 0.2* 0.3 - 1.2 mg/dL Final  . GFR, Est Non Af Am 02/15/2020 >60  >60 mL/min Final  . GFR, Est AFR Am 02/15/2020 >60  >60 mL/min Final  . Anion gap 02/15/2020 8  5 - 15 Final   Performed at [facility], [place] [address], [place], [place] [address]  . Smear Review 02/15/2020 SMEAR STAINED AND AVAILABLE FOR REVIEW   Final   Performed at [facility], [address], [place],  [address]  .  Retic Ct Pct 02/15/2020 1.5  0.4 - 3.1 % Final  . RBC. 02/15/2020 4.56  3.87 - 5.11 MIL/uL Final  . Retic Count, Absolute 02/15/2020 68.9  19.0 - 186.0 K/uL Final  . Immature Retic Fract 02/15/2020 18.9* 2.3 - 15.9 % Final  .  Reticulocyte Hemoglobin 02/15/2020 24.8* >27.9 pg Final   Comment:        A RET-He < 28 pg is an indication of iron-deficient or iron- insufficient erythropoiesis. Patients with thalassemia may also have a decreased RET-He result unrelated to iron availability.     If this patient has chronic kidney disease and does not have a hemoglobinopathy he/she meets criteria for iron deficiency per the 2016 NICE guidelines. Refer to specific guidelines to determine the appropriate thresholds for treating CKD- associated iron deficiency. TSAT and ferritin should be used in patients with hemoglobinopathies (e.g. thalassemia). Performed at [facility], [place] [address] [place], [place] [address]   . WBC Count 02/15/2020 5.4  4.0 - 10.5 K/uL Final  . RBC 02/15/2020 4.56  3.87 - 5.11 MIL/uL Final  . Hemoglobin 02/15/2020 9.0* 12.0 - 15.0 g/dL Final   Comment: Reticulocyte Hemoglobin testing may be clinically indicated, consider ordering this additional test [id number]   . HCT 02/15/2020 31.2* 36.0 - 46.0 % Final  . MCV 02/15/2020 68.4* 80.0 - 100.0 fL Final  . MCH 02/15/2020 19.7* 26.0 - 34.0 pg Final  . MCHC 02/15/2020 28.8* 30.0 - 36.0 g/dL Final  . RDW 02/15/2020 21.4* 11.5 - 15.5 % Final  . Platelet Count 02/15/2020 361  150 - 400 K/uL Final  . nRBC 02/15/2020 0.0  0.0 - 0.2 % Final  . Neutrophils Relative % 02/15/2020 46  % Final  . Neutro Abs 02/15/2020 2.5  1.7 - 7.7 K/uL Final  . Lymphocytes Relative 02/15/2020 41  % Final  . Lymphs Abs 02/15/2020 2.2  0.7 - 4.0 K/uL Final  . Monocytes Relative 02/15/2020 8  % Final  . Monocytes Absolute 02/15/2020 0.4  0.1 - 1.0 K/uL Final  . Eosinophils Relative 02/15/2020 4  % Final  . Eosinophils Absolute 02/15/2020 0.2  0.0 - 0.5 K/uL Final  . Basophils Relative 02/15/2020 1  % Final  . Basophils Absolute 02/15/2020 0.1  0.0 - 0.1 K/uL Final  . Immature Granulocytes 02/15/2020 0  % Final  . Abs Immature  Granulocytes 02/15/2020 0.01  0.00 - 0.07 K/uL Final  . Ovalocytes 02/15/2020 PRESENT   Final   Performed at [facility], [place] [place], [place] [address]    (this displays the last labs from the last 3 days)  No results found for: TOTALPROTELP, ALBUMINELP, A1GS, A2GS, BETS, BETA2SER, GAMS, MSPIKE, SPEI (this displays SPEP labs)  No results found for: KPAFRELGTCHN, LAMBDASER, KAPLAMBRATIO (kappa/lambda light chains)  No results found for: HGBA, HGBA2QUANT, HGBFQUANT, HGBSQUAN (Hemoglobinopathy evaluation)   No results found for: LDH  Lab Results  Component Value Date   IRON 182 (H) 02/15/2020   TIBC 485 (H) 02/15/2020   IRONPCTSAT 37 02/15/2020   (Iron and TIBC)  Lab Results  Component Value Date   FERRITIN 5 (L) 02/15/2020    Urinalysis    Component Value Date/Time   COLORURINE AMBER (A) 06/26/2014 1717   APPEARANCEUR HAZY (A) 06/26/2014 1717   LABSPEC 1.021 06/26/2014 1717   PHURINE 5.5 06/26/2014 1717   GLUCOSEU NEGATIVE 06/26/2014 1717   HGBUR LARGE (A) 06/26/2014 1717   BILIRUBINUR SMALL (A) 06/26/2014 1717  BILIRUBINUR neg 03/15/2014 [place] 06/26/2014 1717   PROTEINUR NEGATIVE 06/26/2014 1717   UROBILINOGEN 1.0 06/26/2014 1717   NITRITE NEGATIVE 06/26/2014 1717   LEUKOCYTESUR SMALL (A) 06/26/2014 1717     STUDIES: No results found.  ASSESSMENT: 49 y.o. [place] woman who is here today for evaluation of iron deficiency anemia.    1. Iron deficiency anemia  (a) related to blood loss from menorrhagia  (b) Taking oral iron x 1 month, ferritin today is 5  (c) To receive IV iron with Feraheme beginning 02/24/2020  PLAN: I reviewed Shikita's labs with her.  We discussed that all blood cells are made in the marrow of bone, and that her white blood cells which are immune cells are normal in number and appearance.  Her platelets which are clotting cells and these are normal in number and appearance.  Her  red blood cells are microcytic with increased central pallor.    Sandy met with myself and Dr. Jana Hakim to review the issue of her iron deficiency anemia.  This is related to her menorrhagia, and while we are happy that her cycles have improved, her ferritin is low, and she would benefit from receiving IV iron.  We reviewed that the only way to lose iron is to bleed, and her cycles are the cause of this.  Her menorrhagia has improved, so we will give her IV feraheme and she will likely only need two treatments of the feraheme given 1 week apart, as her cycles are coming under control with the treatment recommended by her gynecologist.  Dr. Jana Hakim reviewed with her in detail the process for this, and I also gave her detailed information in Spanish about the infusion.    Samani will return on 5/28 and 6/4 for Feraheme, and we will repeat iron studies in 8 weeks.  At that point her labs will likely be stable, and if she is having no further menorrhagia, she will not need to f/u with [place].  We will determine this at that time.    Kaija was given our office information and she knows she can call us at any point between now and her next appointment for any questions or concerns.     Total encounter time: 60 minutes*  Wilber Bihari, NP 02/23/20 6:42 PM Medical Oncology and Hematology [facility] [place], [place] [address] Tel. [phone]    Fax. [phone]   ADDENDUM: 49 year old [place] woman with menometrorrhagia and secondary iron deficiency.  She has attempted iron replacement by mouth but her ferritin today is still 5.  Her reticulocyte count is inappropriately normal at 68.9.  All the labs obtained today show no competing causes for her anemia and specifically no B12 or folate deficiency and no evidence of inflammation  Review of the peripheral blood film is consistent with the diagnosis, with red cells smaller than chronic lymphocyte nuclei with mild  anisocytosis.  There were no significant findings in the platelet or white cell series.  We discussed intravenous replacement by iron.  This is generally safe but can rarely cause reaction some of which may be severe.  The patient is interested in proceeding and infusions have been scheduled for 02/24/2020 and 03/02/2020.  We will check lab work again in July and assuming that shows an adequate response and the tranexamic acid she has been prescribed by her gynecologist continues to control her menorrhagia she will likely need no further follow-up with [place].  I personally saw  this patient and performed a substantive portion of this encounter with the listed APP documented above.   Chauncey Cruel, MD Medical Oncology and Hematology [facility] [address] [place], [place] [address] Tel. [phone]    Fax. [phone]   *Total Encounter Time as defined by the Centers for Medicare and Medicaid Services includes, in addition to the face-to-face time of a patient visit (documented in the note above) non-face-to-face time: obtaining and reviewing outside history, ordering and reviewing medications, tests or procedures, care coordination (communications with other health care professionals or caregivers) and documentation in the medical record.

## 2020-02-15 NOTE — Patient Instructions (Signed)
Ferumoxytol injection Qu es este medicamento? El FERUMOXITOL es un complejo de hierro. El hierro se usa para producir glbulos rojos saludables, que transportan oxgeno y nutrientes por el cuerpo. Este medicamento se usa para tratar la anemia por deficiencia de hierro. Este medicamento puede ser utilizado para otros usos; si tiene alguna pregunta consulte con su proveedor de atencin mdica o con su farmacutico. MARCAS COMUNES: Feraheme Qu le debo informar a mi profesional de la salud antes de tomar este medicamento? Necesita saber si usted presenta alguno de los siguientes problemas o situaciones:  anemia no provocada por niveles bajos de hierro  niveles altos de hierro en la sangre  examen de imgenes por resonancia magntica (IRM) programado  una reaccin alrgica o inusual al hierro, otros medicamentos, alimentos, colorantes o conservantes  si est embarazada o buscando quedar embarazada  si est amamantando a un beb Cmo debo utilizar este medicamento? Este medicamento se administra mediante inyeccin por va intravenosa. Lo administra un profesional de la salud en un hospital o en un entorno clnico. Hable con su pediatra para informarse acerca del uso de este medicamento en nios. Puede requerir atencin especial. Sobredosis: Pngase en contacto inmediatamente con un centro toxicolgico o una sala de urgencia si usted cree que haya tomado demasiado medicamento. ATENCIN: Este medicamento es solo para usted. No comparta este medicamento con nadie. Qu sucede si me olvido de una dosis? Es importante no olvidar ninguna dosis. Informe a su mdico o a su profesional de la salud si no puede asistir a una cita. Qu puede interactuar con este medicamento? Esta medicina puede interactuar con los siguientes medicamentos:  otros productos de hierro Puede ser que esta lista no menciona todas las posibles interacciones. Informe a su profesional de la salud de todos los productos a  base de hierbas, medicamentos de venta libre o suplementos nutritivos que est tomando. Si usted fuma, consume bebidas alcohlicas o si utiliza drogas ilegales, indqueselo tambin a su profesional de la salud. Algunas sustancias pueden interactuar con su medicamento. A qu debo estar atento al usar este medicamento? Visite a su mdico o a su profesional de la salud de manera regular. Si los sntomas no comienzan a mejorar o si empeoran, consulte con su mdico o con su profesional de la salud. Tal vez necesita realizarse anlisis de sangre mientras recibe este medicamento. Tal vez necesita seguir una dieta especial. Consulte a su mdico. Los alimentos que contienen hierro incluyen: alimentos integrales o con cereales, frutas secas, frijoles o arvejas, vegetales de hoja verde y carne que proviene de rganos (hgado, rin). Qu efectos secundarios puedo tener al utilizar este medicamento? Efectos secundarios que debe informar a su mdico o a su profesional de la salud tan pronto como sea posible: reacciones alrgicas, como erupcin cutnea, picazn o urticarias, e hinchazn de la cara, los labios o la lengua problemas respiratorios cambios en la presin sangunea sensacin de desmayos o aturdimiento, cadas fiebre o escalofros enrojecimiento, sudoracin o sensacin de calor hinchazn de tobillos o pies Efectos secundarios que generalmente no requieren atencin mdica (infrmelos a su mdico o a su profesional de la salud si persisten o si son molestos): diarrea dolor de cabeza nuseas, vmito dolor estomacal Puede ser que esta lista no menciona todos los posibles efectos secundarios. Comunquese a su mdico por asesoramiento mdico sobre los efectos secundarios. Usted puede informar los efectos secundarios a la FDA por telfono al 1-800-FDA-1088. Dnde debo guardar mi medicina? Este medicamento se administra en hospitales o clnicas y no necesitar guardarlo   en su domicilio. ATENCIN: Este folleto es un  resumen. Puede ser que no cubra toda la posible informacin. Si usted tiene preguntas acerca de esta medicina, consulte con su mdico, su farmacutico o su profesional de la salud.  2020 Elsevier/Gold Standard (2017-01-06 00:00:00)   

## 2020-02-16 ENCOUNTER — Telehealth: Payer: Self-pay | Admitting: Adult Health

## 2020-02-16 NOTE — Telephone Encounter (Signed)
Scheduled appts per 5/19 los. Pt didn't answer and voicemail box was full. Sent msg to HIM to mail reminder letter and calendar.

## 2020-02-24 ENCOUNTER — Inpatient Hospital Stay: Payer: Commercial Managed Care - PPO

## 2020-02-24 ENCOUNTER — Other Ambulatory Visit: Payer: Self-pay

## 2020-02-24 VITALS — BP 120/71 | HR 84 | Temp 98.2°F | Resp 17

## 2020-02-24 DIAGNOSIS — D500 Iron deficiency anemia secondary to blood loss (chronic): Secondary | ICD-10-CM

## 2020-02-24 MED ORDER — SODIUM CHLORIDE 0.9 % IV SOLN
Freq: Once | INTRAVENOUS | Status: AC
  Filled 2020-02-24: qty 250

## 2020-02-24 MED ORDER — SODIUM CHLORIDE 0.9 % IV SOLN
510.0000 mg | Freq: Once | INTRAVENOUS | Status: AC
  Administered 2020-02-24: 510 mg via INTRAVENOUS
  Filled 2020-02-24: qty 510

## 2020-02-24 NOTE — Patient Instructions (Signed)

## 2020-03-02 ENCOUNTER — Other Ambulatory Visit: Payer: Self-pay

## 2020-03-02 ENCOUNTER — Inpatient Hospital Stay: Payer: Commercial Managed Care - PPO | Attending: Adult Health

## 2020-03-02 VITALS — BP 119/60 | HR 67 | Temp 98.2°F | Resp 17

## 2020-03-02 DIAGNOSIS — Z79899 Other long term (current) drug therapy: Secondary | ICD-10-CM | POA: Diagnosis not present

## 2020-03-02 DIAGNOSIS — D500 Iron deficiency anemia secondary to blood loss (chronic): Secondary | ICD-10-CM | POA: Diagnosis not present

## 2020-03-02 MED ORDER — SODIUM CHLORIDE 0.9 % IV SOLN
510.0000 mg | Freq: Once | INTRAVENOUS | Status: AC
  Administered 2020-03-02: 510 mg via INTRAVENOUS
  Filled 2020-03-02: qty 510

## 2020-03-02 MED ORDER — SODIUM CHLORIDE 0.9 % IV SOLN
Freq: Once | INTRAVENOUS | Status: AC
  Filled 2020-03-02: qty 250

## 2020-03-02 NOTE — Patient Instructions (Signed)

## 2020-04-13 ENCOUNTER — Other Ambulatory Visit: Payer: Self-pay

## 2020-04-13 ENCOUNTER — Inpatient Hospital Stay: Payer: Commercial Managed Care - PPO | Attending: Adult Health

## 2020-04-13 DIAGNOSIS — D500 Iron deficiency anemia secondary to blood loss (chronic): Secondary | ICD-10-CM | POA: Insufficient documentation

## 2020-04-13 DIAGNOSIS — N920 Excessive and frequent menstruation with regular cycle: Secondary | ICD-10-CM | POA: Insufficient documentation

## 2020-04-13 DIAGNOSIS — D649 Anemia, unspecified: Secondary | ICD-10-CM

## 2020-04-13 LAB — CBC WITH DIFFERENTIAL (CANCER CENTER ONLY)
Abs Immature Granulocytes: 0.01 10*3/uL (ref 0.00–0.07)
Basophils Absolute: 0.1 10*3/uL (ref 0.0–0.1)
Basophils Relative: 1 %
Eosinophils Absolute: 0.2 10*3/uL (ref 0.0–0.5)
Eosinophils Relative: 4 %
HCT: 39.4 % (ref 36.0–46.0)
Hemoglobin: 13.1 g/dL (ref 12.0–15.0)
Immature Granulocytes: 0 %
Lymphocytes Relative: 36 %
Lymphs Abs: 2.2 10*3/uL (ref 0.7–4.0)
MCH: 26.7 pg (ref 26.0–34.0)
MCHC: 33.2 g/dL (ref 30.0–36.0)
MCV: 80.4 fL (ref 80.0–100.0)
Monocytes Absolute: 0.4 10*3/uL (ref 0.1–1.0)
Monocytes Relative: 7 %
Neutro Abs: 3.2 10*3/uL (ref 1.7–7.7)
Neutrophils Relative %: 52 %
Platelet Count: 328 10*3/uL (ref 150–400)
RBC: 4.9 MIL/uL (ref 3.87–5.11)
RDW: 24.2 % — ABNORMAL HIGH (ref 11.5–15.5)
WBC Count: 6 10*3/uL (ref 4.0–10.5)
nRBC: 0 % (ref 0.0–0.2)

## 2020-04-16 LAB — FERRITIN: Ferritin: 55 ng/mL (ref 11–307)

## 2020-04-16 LAB — IRON AND TIBC
Iron: 63 ug/dL (ref 41–142)
Saturation Ratios: 19 % — ABNORMAL LOW (ref 21–57)
TIBC: 343 ug/dL (ref 236–444)
UIBC: 279 ug/dL (ref 120–384)

## 2021-02-08 ENCOUNTER — Other Ambulatory Visit: Payer: Self-pay

## 2021-02-08 ENCOUNTER — Emergency Department (HOSPITAL_COMMUNITY): Payer: Commercial Managed Care - PPO

## 2021-02-08 ENCOUNTER — Emergency Department (HOSPITAL_COMMUNITY)
Admission: EM | Admit: 2021-02-08 | Discharge: 2021-02-09 | Disposition: A | Discharge status: Home / Self Care | Payer: Commercial Managed Care - PPO | Attending: Emergency Medicine | Admitting: Emergency Medicine

## 2021-02-08 DIAGNOSIS — S52571A Other intraarticular fracture of lower end of right radius, initial encounter for closed fracture: Secondary | ICD-10-CM | POA: Insufficient documentation

## 2021-02-08 DIAGNOSIS — R0789 Other chest pain: Secondary | ICD-10-CM | POA: Insufficient documentation

## 2021-02-08 DIAGNOSIS — Y92410 Unspecified street and highway as the place of occurrence of the external cause: Secondary | ICD-10-CM | POA: Diagnosis not present

## 2021-02-08 DIAGNOSIS — S6991XA Unspecified injury of right wrist, hand and finger(s), initial encounter: Secondary | ICD-10-CM | POA: Diagnosis present

## 2021-02-08 DIAGNOSIS — S62101A Fracture of unspecified carpal bone, right wrist, initial encounter for closed fracture: Secondary | ICD-10-CM

## 2021-02-08 MED ORDER — OXYCODONE-ACETAMINOPHEN 5-325 MG PO TABS
1.0000 | ORAL_TABLET | Freq: Once | ORAL | Status: AC
  Administered 2021-02-08: 1 via ORAL
  Filled 2021-02-08: qty 1
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
## 2021-02-09 ENCOUNTER — Emergency Department (HOSPITAL_COMMUNITY): Payer: Commercial Managed Care - PPO

## 2021-02-09 ENCOUNTER — Telehealth: Payer: Self-pay

## 2021-02-09 DIAGNOSIS — S52571A Other intraarticular fracture of lower end of right radius, initial encounter for closed fracture: Secondary | ICD-10-CM | POA: Diagnosis not present

## 2021-02-09 MED ORDER — IBUPROFEN 600 MG PO TABS: 600.0000 mg | ORAL_TABLET | Freq: Four times a day (QID) | ORAL | 0 refills | Status: AC | PRN

## 2021-02-09 MED ORDER — OXYCODONE-ACETAMINOPHEN 5-325 MG PO TABS: 1.0000 | ORAL_TABLET | ORAL | 0 refills | Status: AC | PRN

## 2021-02-09 MED ORDER — CYCLOBENZAPRINE HCL 10 MG PO TABS: 10.0000 mg | ORAL_TABLET | Freq: Two times a day (BID) | ORAL | 0 refills | Status: AC | PRN

## 2021-02-09 NOTE — Discharge Instructions (Addendum)
Follow up with Dr. Izora Ribas for treatment of your wrist fracture

## 2021-02-09 NOTE — Telephone Encounter (Signed)
Question about oxycodone order pharmacy recommendation to limit to 6 tabs a day to stay within guidelines approved.
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
## 2021-02-09 NOTE — Progress Notes (Signed)
Orthopedic Tech Progress Note Patient Details:  Renee [name] Apr 23, 1971 [id number]  Ortho Devices Type of Ortho Device: Sugartong splint,Arm sling Ortho Device/Splint Location: rue. I had to cut off the pts ring due to the finger being to swollen to remove the ring. Ortho Device/Splint Interventions: Ordered,Application,Adjustment   Post Interventions Patient Tolerated: Well Instructions Provided: Care of device,Adjustment of device   Trinna Post 02/09/2021, 5:26 AM

## 2021-08-15 ENCOUNTER — Ambulatory Visit (HOSPITAL_COMMUNITY)
Admission: EM | Admit: 2021-08-15 | Discharge: 2021-08-15 | Disposition: A | Discharge status: Home / Self Care | Payer: Commercial Managed Care - PPO | Attending: Internal Medicine | Admitting: Internal Medicine

## 2021-08-15 ENCOUNTER — Other Ambulatory Visit: Payer: Self-pay

## 2021-08-15 ENCOUNTER — Encounter (HOSPITAL_COMMUNITY): Payer: Self-pay | Admitting: Emergency Medicine

## 2021-08-15 DIAGNOSIS — L232 Allergic contact dermatitis due to cosmetics: Secondary | ICD-10-CM

## 2021-08-15 LAB — CBC
HCT: 38.4 % (ref 36.0–46.0)
Hemoglobin: 12.5 g/dL (ref 12.0–15.0)
MCH: 26.8 pg (ref 26.0–34.0)
MCHC: 32.6 g/dL (ref 30.0–36.0)
MCV: 82.4 fL (ref 80.0–100.0)
Platelets: 388 10*3/uL (ref 150–400)
RBC: 4.66 MIL/uL (ref 3.87–5.11)
RDW: 16.6 % — ABNORMAL HIGH (ref 11.5–15.5)
WBC: 7.7 10*3/uL (ref 4.0–10.5)
nRBC: 0 % (ref 0.0–0.2)

## 2021-08-15 MED ORDER — PREDNISONE 20 MG PO TABS: 20.0000 mg | ORAL_TABLET | Freq: Every day | ORAL | 0 refills | Status: AC

## 2021-08-15 MED ORDER — HYDROXYZINE HCL 25 MG PO TABS: 25.0000 mg | ORAL_TABLET | Freq: Three times a day (TID) | ORAL | 0 refills | Status: AC | PRN

## 2021-08-15 NOTE — ED Triage Notes (Signed)
Pt is present today with lip swelling and hives on her arms and lower back. Pt states that she noticed her reaction 11/5.

## 2021-08-15 NOTE — Discharge Instructions (Addendum)
Discontinue cosmetics and new body lotions Use mild body lotion like Aveeno and mild soap like Dove Take medications as prescribed If you have worsening symptoms please return to urgent care to be reevaluated.

## 2021-08-15 NOTE — ED Provider Notes (Signed)
[facility]    CSN: [id number] Arrival date & time: 08/15/21  1325      History   Chief Complaint Chief Complaint  Patient presents with   Rash   Oral Swelling    HPI Renee [name] is a 50 y.o. female comes to the urgent care with lip swelling with itching over the past couple of weeks.  Patient started using a new cosmetics over that timeframe.  Symptoms are intermittent and associated with swelling of the upper lip.  She is also noticed some hives over the torso and extremities.  This is also been intermittent in nature.  No shortness of breath or wheezing.  No throat swelling or tongue swelling.  Patient does not take ACE inhibitor's.  Patient has a history of anemia.  She was previously treated with IV iron infusions.  She denies any dizziness, near syncope or syncopal episodes.  HPI  Past Medical History:  Diagnosis Date   Gallstone     Patient Active Problem List   Diagnosis Date Noted   Iron deficiency anemia 02/15/2020   Iron deficiency anemia due to chronic blood loss 02/15/2020   Gallstone pancreatitis 06/26/2014   Back pain, chronic 02/04/2014   Gall bladder stones 02/28/2013    Past Surgical History:  Procedure Laterality Date   CHOLECYSTECTOMY N/A 06/29/2014   Procedure: LAPAROSCOPIC CHOLECYSTECTOMY WITH INTRAOPERATIVE CHOLANGIOGRAM;  Surgeon: Violeta Gelinas, MD;  Location: [facility] OR;  Service: General;  Laterality: N/A;    OB History   No obstetric history on file.      Home Medications    Prior to Admission medications   Medication Sig Start Date End Date Taking? Authorizing Provider  hydrOXYzine (ATARAX/VISTARIL) 25 MG tablet Take 1 tablet (25 mg total) by mouth every 8 (eight) hours as needed. 08/15/21  Yes Chris Cripps, Britta Mccreedy, MD  predniSONE (DELTASONE) 20 MG tablet Take 1 tablet (20 mg total) by mouth daily for 3 days. 08/15/21 08/18/21 Yes Mithra Spano, Britta Mccreedy, MD  cyclobenzaprine (FLEXERIL) 10 MG tablet Take 1 tablet (10 mg total) by  mouth 2 (two) times daily as needed for muscle spasms. 02/09/21   Elpidio Anis, PA-C  Ferrous Gluconate (IRON 27 PO) Take by mouth daily.    [provider]  ibuprofen (ADVIL) 600 MG tablet Take 1 tablet (600 mg total) by mouth every 6 (six) hours as needed. 02/09/21   Elpidio Anis, PA-C  oxyCODONE-acetaminophen (PERCOCET/ROXICET) 5-325 MG tablet Take 1-2 tablets by mouth every 4 (four) hours as needed for severe pain. 02/09/21   Elpidio Anis, PA-C  tranexamic acid (LYSTEDA) 650 MG TABS tablet Take 1,300 mg by mouth 3 (three) times daily.    [provider]    Family History Family History  Problem Relation Age of Onset   Diabetes Mother    Diabetes Father    Diabetes Brother     Social History Social History   Tobacco Use   Smoking status: Never   Smokeless tobacco: Never  Substance Use Topics   Alcohol use: No   Drug use: No     Allergies   Patient has no known allergies.   Review of Systems Review of Systems  Constitutional: Negative.   HENT:  Negative for sore throat, trouble swallowing and voice change.   Musculoskeletal: Negative.  Negative for joint swelling.  Skin:  Positive for color change. Negative for rash and wound.    Physical Exam Triage Vital Signs ED Triage Vitals [08/15/21 1450]  Enc Vitals Group  BP (!) 139/50     Pulse Rate 62     Resp 18     Temp 98.5 F (36.9 C)     Temp src      SpO2 96 %     Weight      Height      Head Circumference      Peak Flow      Pain Score 0     Pain Loc      Pain Edu?      Excl. in GC?    No data found.  Updated Vital Signs BP (!) 139/50   Pulse 62   Temp 98.5 F (36.9 C)   Resp 18   SpO2 96%   Visual Acuity Right Eye Distance:   Left Eye Distance:   Bilateral Distance:    Right Eye Near:   Left Eye Near:    Bilateral Near:     Physical Exam Vitals and nursing note reviewed.  Constitutional:      General: She is not in acute distress.    Appearance: She is not  ill-appearing.  Cardiovascular:     Rate and Rhythm: Normal rate and regular rhythm.  Pulmonary:     Effort: Pulmonary effort is normal.     Breath sounds: Normal breath sounds. No wheezing or rhonchi.  Abdominal:     General: Bowel sounds are normal.     Palpations: Abdomen is soft.  Musculoskeletal:        General: Normal range of motion.  Neurological:     Mental Status: She is alert.     UC Treatments / Results  Labs (all labs ordered are listed, but only abnormal results are displayed) Labs Reviewed  CBC    EKG   Radiology No results found.  Procedures Procedures (including critical care time)  Medications Ordered in UC Medications - No data to display  Initial Impression / Assessment and Plan / UC Course  I have reviewed the triage vital signs and the nursing notes.  Pertinent labs & imaging results that were available during my care of the patient were reviewed by me and considered in my medical decision making (see chart for details).     1.  Allergic contact dermatitis secondary to cosmetics: Patient is advised to stop using new cosmetics Patient is advised to revert to using mild soap and hypoallergenic body lotion Prednisone 20 mg orally daily x3 days Hydroxyzine every 8 hours as needed for itching If symptoms worsen please return to urgent care to be reevaluated  2.  Chronic iron deficiency anemia: CBC We will call patient with recommendations if labs are abnormal. Final Clinical Impressions(s) / UC Diagnoses   Final diagnoses:  Allergic contact dermatitis due to cosmetics     Discharge Instructions      Discontinue cosmetics and new body lotions Use mild body lotion like Aveeno and mild soap like Dove Take medications as prescribed If you have worsening symptoms please return to urgent care to be reevaluated.   ED Prescriptions     Medication Sig Dispense Auth. Provider   hydrOXYzine (ATARAX/VISTARIL) 25 MG tablet Take 1 tablet (25  mg total) by mouth every 8 (eight) hours as needed. 20 tablet Offie Pickron, Britta Mccreedy, MD   predniSONE (DELTASONE) 20 MG tablet Take 1 tablet (20 mg total) by mouth daily for 3 days. 3 tablet Mouhamed Glassco, Britta Mccreedy, MD      PDMP not reviewed this encounter.   Merrilee Jansky, MD 08/15/21  1542  

## 2021-09-07 ENCOUNTER — Other Ambulatory Visit: Payer: Self-pay

## 2021-09-07 ENCOUNTER — Ambulatory Visit (HOSPITAL_COMMUNITY)
Admission: EM | Admit: 2021-09-07 | Discharge: 2021-09-07 | Disposition: A | Discharge status: Home / Self Care | Payer: Commercial Managed Care - PPO | Attending: Emergency Medicine | Admitting: Emergency Medicine

## 2021-09-07 ENCOUNTER — Encounter (HOSPITAL_COMMUNITY): Payer: Self-pay

## 2021-09-07 DIAGNOSIS — L509 Urticaria, unspecified: Secondary | ICD-10-CM | POA: Diagnosis not present

## 2021-09-07 MED ORDER — PREDNISONE 50 MG PO TABS: 50.0000 mg | ORAL_TABLET | Freq: Every day | ORAL | 0 refills | Status: AC

## 2021-09-07 MED ORDER — LEVOCETIRIZINE DIHYDROCHLORIDE 5 MG PO TABS: 5.0000 mg | ORAL_TABLET | Freq: Every evening | ORAL | 2 refills | Status: AC

## 2021-09-07 NOTE — ED Provider Notes (Signed)
[facility]  ____________________________________________  Time seen: Approximately 10:43 AM  I have reviewed the triage vital signs and the nursing notes.   HISTORY  Chief Complaint Allergic Reaction    HPI Renee [name] is a 50 y.o. female who presents to the urgent care complaining of ongoing rash.  Patient has been having intermittent hives for approximately a month.  She does state that she will have hives for 2 to 3 days, does not disappear for 2 to 3 days, and then return.  She thought these had started after trying a new make-up, however she has gotten rid of this make up, is trying to eliminate what ever may be causing the rash but she cannot determine where it is originating.  No respiratory or GI symptoms.       Past Medical History:  Diagnosis Date   Gallstone     Patient Active Problem List   Diagnosis Date Noted   Iron deficiency anemia 02/15/2020   Iron deficiency anemia due to chronic blood loss 02/15/2020   Gallstone pancreatitis 06/26/2014   Back pain, chronic 02/04/2014   Gall bladder stones 02/28/2013    Past Surgical History:  Procedure Laterality Date   CHOLECYSTECTOMY N/A 06/29/2014   Procedure: LAPAROSCOPIC CHOLECYSTECTOMY WITH INTRAOPERATIVE CHOLANGIOGRAM;  Surgeon: Violeta Gelinas, MD;  Location: [facility] OR;  Service: General;  Laterality: N/A;    Prior to Admission medications   Medication Sig Start Date End Date Taking? Authorizing Provider  levocetirizine (XYZAL) 5 MG tablet Take 1 tablet (5 mg total) by mouth every evening. 09/07/21  Yes Arelis Neumeier, Delorise Royals, PA-C  predniSONE (DELTASONE) 50 MG tablet Take 1 tablet (50 mg total) by mouth daily with breakfast. 09/07/21  Yes Karissa Meenan, Delorise Royals, PA-C  cyclobenzaprine (FLEXERIL) 10 MG tablet Take 1 tablet (10 mg total) by mouth 2 (two) times daily as needed for muscle spasms. 02/09/21   Elpidio Anis, PA-C  Ferrous Gluconate (IRON 27 PO) Take by mouth daily.    [provider]   hydrOXYzine (ATARAX/VISTARIL) 25 MG tablet Take 1 tablet (25 mg total) by mouth every 8 (eight) hours as needed. 08/15/21   Merrilee Jansky, MD  ibuprofen (ADVIL) 600 MG tablet Take 1 tablet (600 mg total) by mouth every 6 (six) hours as needed. 02/09/21   Elpidio Anis, PA-C  oxyCODONE-acetaminophen (PERCOCET/ROXICET) 5-325 MG tablet Take 1-2 tablets by mouth every 4 (four) hours as needed for severe pain. 02/09/21   Elpidio Anis, PA-C  tranexamic acid (LYSTEDA) 650 MG TABS tablet Take 1,300 mg by mouth 3 (three) times daily.    [provider]    Allergies Patient has no known allergies.  Family History  Problem Relation Age of Onset   Diabetes Mother    Diabetes Father    Diabetes Brother     Social History Social History   Tobacco Use   Smoking status: Never   Smokeless tobacco: Never  Substance Use Topics   Alcohol use: No   Drug use: No     Review of Systems  Constitutional: No fever/chills Eyes: No visual changes. No discharge ENT: No upper respiratory complaints. Cardiovascular: no chest pain. Respiratory: no cough. No SOB. Gastrointestinal: No abdominal pain.  No nausea, no vomiting.  No diarrhea.  No constipation. Musculoskeletal: Negative for musculoskeletal pain. Skin: Recurrent hives Neurological: Negative for headaches, focal weakness or numbness.  10 System ROS otherwise negative.  ____________________________________________   PHYSICAL EXAM:  VITAL SIGNS: ED Triage Vitals  Enc Vitals Group  BP 09/07/21 1027 131/64     Pulse Rate 09/07/21 1027 80     Resp 09/07/21 1027 18     Temp 09/07/21 1027 98 F (36.7 C)     Temp Source 09/07/21 1027 Oral     SpO2 09/07/21 1027 95 %     Weight 09/07/21 1023 150 lb (68 kg)     Height 09/07/21 1023 5\' 2"  (1.575 m)     Head Circumference --      Peak Flow --      Pain Score 09/07/21 1022 0     Pain Loc --      Pain Edu? --      Excl. in GC? --      Constitutional: Alert and oriented.  Well appearing and in no acute distress. Eyes: Conjunctivae are normal. PERRL. EOMI. Head: Atraumatic. ENT:      Ears:       Nose: No congestion/rhinnorhea.      Mouth/Throat: Mucous membranes are moist.  Neck: No stridor.    Cardiovascular: Normal rate, regular rhythm. Normal S1 and S2.  Good peripheral circulation. Respiratory: Normal respiratory effort without tachypnea or retractions. Lungs CTAB. Good air entry to the bases with no decreased or absent breath sounds. Musculoskeletal: Full range of motion to all extremities. No gross deformities appreciated. Neurologic:  Normal speech and language. No gross focal neurologic deficits are appreciated.  Skin:  Skin is warm, dry and intact. No rash noted.  Patient has 3 hives to the face, no other hives noted.  Hives to both around locations, right lip. Psychiatric: Mood and affect are normal. Speech and behavior are normal. Patient exhibits appropriate insight and judgement.   ____________________________________________   LABS (all labs ordered are listed, but only abnormal results are displayed)  Labs Reviewed - No data to display ____________________________________________  EKG   ____________________________________________  RADIOLOGY   No results found.  ____________________________________________    PROCEDURES  Procedure(s) performed:    Procedures    Medications - No data to display   ____________________________________________   INITIAL IMPRESSION / ASSESSMENT AND PLAN / ED COURSE  Pertinent labs & imaging results that were available during my care of the patient were reviewed by me and considered in my medical decision making (see chart for details).  Review of the [place] CSRS was performed in accordance of the [facility] prior to dispensing any controlled drugs.           Patient's diagnosis is consistent with hives.  Patient presents to the urgent care for recurrent hives.  He has been going on for  approximately a month.  She thought initially it was due to some make-up, but has not used this and is still having symptoms.  Patient denies any knowledge of other allergies.  At this time I will place the patient on antihistamine and refer her for allergy skin testing..  Patient is given ED precautions to return to the ED for any worsening or new symptoms.     ____________________________________________  FINAL CLINICAL IMPRESSION(S) / DIAGNOSES  Final diagnoses:  Hives      NEW MEDICATIONS STARTED DURING THIS VISIT:  ED Discharge Orders          Ordered    levocetirizine (XYZAL) 5 MG tablet  Every evening        09/07/21 1047    predniSONE (DELTASONE) 50 MG tablet  Daily with breakfast        09/07/21 1047  This chart was dictated using voice recognition software/Dragon. Despite best efforts to proofread, errors can occur which can change the meaning. Any change was purely unintentional.    Racheal Patches, PA-C 09/07/21 1050

## 2021-09-07 NOTE — ED Triage Notes (Signed)
Pt c/o continued allergic reaction from last visit on 08/15/21. Pt states that she had finished the medication prescribed but the hives have continued and are now affecting her eyes and face. Pt states that it does not hurt, just itches. Pt states that more hives appear along her hands when she washes them but not when she showers.

## 2023-10-29 ENCOUNTER — Encounter: Payer: Self-pay | Admitting: Adult Health

## 2024-02-03 ENCOUNTER — Telehealth: Payer: Self-pay | Admitting: *Deleted

## 2024-02-03 NOTE — Telephone Encounter (Signed)
 Attempt to reach pt to explain that her procedure will need to be done at hospital due to her history with difficult intubation. LM with call back #.

## 2024-02-03 NOTE — Telephone Encounter (Signed)
 Dr. Yvone Herd, please see notes below. This was a direct colonoscopy. OK to schedule direct in hospital setting or does patient need an OV?

## 2024-02-03 NOTE — Telephone Encounter (Signed)
 Team,  This pt is a documented difficult intubation and his procedure will need to be done at the hospital.   Thanks,  Cathryn Cobb

## 2024-02-04 ENCOUNTER — Other Ambulatory Visit: Payer: Self-pay

## 2024-02-04 DIAGNOSIS — Z1211 Encounter for screening for malignant neoplasm of colon: Secondary | ICD-10-CM

## 2024-02-04 NOTE — Telephone Encounter (Signed)
 Patient has been scheduled for next available hospital slot. Colonoscopy scheduled at [facility] with Dr. Rosaline Coma on 03/14/24 at 11 am (arrival time: 9:30 am).   [facility] colonoscopy has been cancelled

## 2024-02-04 NOTE — Telephone Encounter (Signed)
 PT called to find out if there was any further information she needed for her new appointment. Please advise.

## 2024-02-05 ENCOUNTER — Encounter: Payer: Self-pay | Admitting: Adult Health

## 2024-02-05 NOTE — Telephone Encounter (Signed)
 Left a message for the patient that one of the nurses would be calling he on 02/09/24 @10am  for her pre visit and that we would go over the instructions during her pre visit  Updated new prep instructions for upcoming pre visit

## 2024-02-09 ENCOUNTER — Encounter: Payer: Self-pay | Admitting: Internal Medicine

## 2024-02-09 ENCOUNTER — Ambulatory Visit (AMBULATORY_SURGERY_CENTER)

## 2024-02-09 VITALS — Ht 62.0 in | Wt 160.0 lb

## 2024-02-09 DIAGNOSIS — Z1211 Encounter for screening for malignant neoplasm of colon: Secondary | ICD-10-CM

## 2024-02-09 MED ORDER — NA SULFATE-K SULFATE-MG SULF 17.5-3.13-1.6 GM/177ML PO SOLN: 1.0000 | Freq: Once | ORAL | 0 refills | Status: AC

## 2024-02-09 NOTE — Progress Notes (Addendum)
 No egg or soy allergy known to patient  No issues known to pt with past sedation with any surgeries or procedures Patient denies ever being told they had issues or difficulty with intubation  No FH of Malignant Hyperthermia Pt is not on diet pills Pt is not on  home 02  Pt is not on blood thinners  Pt denies issues with constipation  No A fib or A flutter Have any cardiac testing pending-- no  LOA: independent  Prep: suprep    PV completed with patient. Prep instructions sent to patients home address

## 2024-03-07 ENCOUNTER — Telehealth: Payer: Self-pay | Admitting: Internal Medicine

## 2024-03-07 NOTE — Telephone Encounter (Signed)
 Patient requesting to cancel upcoming procedure. She believes she has a illergic reaction to something . States she has broken out into hives.   Scheduled for hospital procedure 6/16. Please advise   Thank you

## 2024-03-07 NOTE — Telephone Encounter (Signed)
 Please advise on this patient- has a hospital procedure Thank you Bre, PV RN

## 2024-03-08 ENCOUNTER — Encounter: Admitting: Pediatrics

## 2024-03-08 NOTE — Telephone Encounter (Signed)
 Left message for pt to call back

## 2024-03-09 ENCOUNTER — Telehealth: Payer: Self-pay

## 2024-03-09 NOTE — Telephone Encounter (Addendum)
 Procedure:colon Procedure date: 03/14/24 Procedure location: [facility] Arrival Time: 10am Spoke with the patient Y/N: left detail Vm on 6/11 to call back to confirm procedure. Call pt at 4:02 pm  on 6/12 left VM to call back to confirm her procedure. Any prep concerns? ,  Has the patient obtained the prep from the pharmacy ? Aaron Aas Do you have a care partner and transportation: . Any additional concerns? Aaron Aas

## 2024-03-09 NOTE — Telephone Encounter (Signed)
 Left message for pt to call back

## 2024-03-10 NOTE — Telephone Encounter (Signed)
 Left message for patient to call back

## 2024-03-11 NOTE — Telephone Encounter (Signed)
 Left message for patient to call back

## 2024-03-11 NOTE — Telephone Encounter (Signed)
Procedure cancelled.

## 2024-03-11 NOTE — Telephone Encounter (Addendum)
 Daughter reached & she will have mom call us  to confirm procedure. She understands that procedure will be cancelled if not confirmed.

## 2024-03-11 NOTE — Telephone Encounter (Signed)
 Multiple attempts have been made to reach patient (separate phone encounter 03/07/24). Left message for husband to call back as well.

## 2024-03-14 ENCOUNTER — Encounter (HOSPITAL_COMMUNITY): Admission: RE | Payer: Self-pay | Source: Home / Self Care

## 2024-03-14 ENCOUNTER — Ambulatory Visit (HOSPITAL_COMMUNITY): Admission: RE | Admit: 2024-03-14 | Source: Home / Self Care | Admitting: Internal Medicine

## 2024-03-14 SURGERY — COLONOSCOPY
Anesthesia: Monitor Anesthesia Care
